# Patient Record
Sex: Male | Born: 1970 | Race: White | Hispanic: No | Marital: Single | Smoking: Current every day smoker
Health system: Southern US, Academic
[De-identification: ages and names within clinical notes are randomized; demographics above are authoritative.]

## PROBLEM LIST (undated history)

## (undated) ENCOUNTER — Encounter (HOSPITAL_COMMUNITY): Admission: RE | Payer: Self-pay | Source: Ambulatory Visit

## (undated) DIAGNOSIS — R519 Headache, unspecified: Secondary | ICD-10-CM

## (undated) DIAGNOSIS — Z9889 Other specified postprocedural states: Secondary | ICD-10-CM

## (undated) DIAGNOSIS — E119 Type 2 diabetes mellitus without complications: Secondary | ICD-10-CM

## (undated) DIAGNOSIS — K219 Gastro-esophageal reflux disease without esophagitis: Secondary | ICD-10-CM

## (undated) HISTORY — PX: HX GALL BLADDER SURGERY/CHOLE: SHX55

## (undated) HISTORY — DX: Other specified postprocedural states: Z98.890

## (undated) HISTORY — PX: HX TONSILLECTOMY: SHX27

## (undated) HISTORY — PX: SINUS SURGERY: SHX187

## (undated) HISTORY — PX: PENIS SURGERY: SHX741

## (undated) HISTORY — DX: Headache, unspecified: R51.9

## (undated) HISTORY — PX: LASIK: SHX215

## (undated) HISTORY — PX: HX VASECTOMY: SHX75

## (undated) HISTORY — PX: HX BACK SURGERY: SHX140

## (undated) HISTORY — PX: HX WISDOM TEETH EXTRACTION: SHX21

## (undated) HISTORY — PX: HX APPENDECTOMY: SHX54

## (undated) SURGERY — ENDOSCOPIC SINUS SURGERY WITH SEPTOPLASTY BILATERAL
Anesthesia: General | Site: Nose | Laterality: Bilateral

---
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
## 2019-02-05 ENCOUNTER — Encounter (INDEPENDENT_AMBULATORY_CARE_PROVIDER_SITE_OTHER): Payer: Self-pay | Admitting: Otolaryngology

## 2019-02-05 ENCOUNTER — Ambulatory Visit (INDEPENDENT_AMBULATORY_CARE_PROVIDER_SITE_OTHER): Payer: BC Managed Care – PPO | Admitting: Otolaryngology

## 2019-02-05 ENCOUNTER — Other Ambulatory Visit: Payer: Self-pay

## 2019-02-05 VITALS — BP 156/86 | HR 93 | Temp 97.8°F | Ht 71.0 in | Wt 234.1 lb

## 2019-02-05 DIAGNOSIS — T485X5A Adverse effect of other anti-common-cold drugs, initial encounter: Secondary | ICD-10-CM

## 2019-02-05 DIAGNOSIS — J329 Chronic sinusitis, unspecified: Secondary | ICD-10-CM

## 2019-02-05 DIAGNOSIS — J310 Chronic rhinitis: Secondary | ICD-10-CM

## 2019-02-05 DIAGNOSIS — J339 Nasal polyp, unspecified: Secondary | ICD-10-CM

## 2019-02-05 DIAGNOSIS — J342 Deviated nasal septum: Secondary | ICD-10-CM

## 2019-02-05 NOTE — Progress Notes (Signed)
[facility]  OTOLARYNGOLOGY DEPARTMENT    HISTORY AND PHYSICAL    PATIENT NAME:  James Frost  MRN:  [id number]  DOB:  1970/04/06  DATE OF SERVICE:  02/05/2019      Chief Complaint:    Chief Complaint   Patient presents with   . Sinus Problem   . Nasal Airway Problem       HPI:  James Frost is a 49 y.o. male presenting for evaluation of nasal obstruction and sinusitis.  He report complete obstruction on the left side of his nose in the past 2 months.  He reports recurrent sinusitis all the summer and he has had multiple courses of antibiotics including Z-Pak multiple times and Bactrim last time.  He had CT scan of his sinuses few days ago.  He is using generic Afrin every day which is helping the right side but not the left side.  No epistaxis.    SNOT-22 score: 32    Past Medical History:  Past Medical History:   Diagnosis Date   . Headache    . Hx of LASIK            Past Surgical History:  Past Surgical History:   Procedure Laterality Date   . HX APPENDECTOMY     . HX CHOLECYSTECTOMY     . HX TONSILLECTOMY     . HX VASECTOMY     . HX WISDOM TEETH EXTRACTION     . LASIK             Medications:  Current Outpatient Medications   Medication Sig   . omeprazole (PRILOSEC) 20 mg Oral Capsule, Delayed Release(E.C.)    . sildenafiL, pulm.hypertension, (REVATIO) 20 mg Tablet TAKE 1 5 PILLS BY MOUTH AS NEEDED ONE HOUR PRIOR TO INTERCOURSE ON AN EMPTY STOMACH (NO MORE THAN 5 IN 24 HOURS)   . zolpidem (AMBIEN) 5 mg Oral Tablet TAKE 1 TABLET BY MOUTH AT BEDTIME       Allergies:  No Known Allergies    Family History:  Family Medical History:     Problem Relation (Age of Onset)    Cancer Paternal Grandfather    Diabetes Mother, Father, Maternal Grandmother              Social History:  Social History     Tobacco Use   Smoking Status Current Every Day Smoker   . Packs/day: 1.00   . Years: 31.00   . Pack years: 31.00   . Types: Cigarettes   Smokeless Tobacco Current User   . Types: Chew   Tobacco  Comment    Trying to quit     Social History     Substance and Sexual Activity   Alcohol Use Yes   . Alcohol/week: 1.0 standard drinks   . Types: 1 Cans of beer per week         Review of Systems:  No fever chest pain or shortness of breath  All other systems were reviewed and found to be negative except the ones mentioned in the HPI    PHYSICAL EXAM    Vitals:    02/05/19 1432   BP: (!) 156/86   Pulse: 93   Temp: 36.6 C (97.8 F)   SpO2: 96%   Weight: 106 kg (234 lb 2.1 oz)   Height: 1.803 m (5\' 11" )   BMI: 32.72  General Appearance: Pleasant, cooperative, healthy, and in no acute distress.  Eyes: Conjunctivae/corneas clear, EOM's intact.  Head and Face: Normocephalic, atraumatic.  Face symmetric, no obvious lesions.   Pinnae: Normal shape and position.   External auditory canals:  Patent without inflammation.  Tympanic membranes:  Intact, translucent, midposition, middle ear aerated.  Nose:  External pyramid midline.  See scope note.  Oral Cavity/Oropharynx: No mucosal lesions, masses, or pharyngeal asymmetry.  Neck:  No palpable thyroid, salivary gland, or neck masses.  Heme/Lymph:  No cervical adenopathy.  Cardiovascular:  Good perfusion of upper extremities.  No cyanosis of the hands or fingers.  Lungs: No apparent stridorous breathing. No acute distress.  Skin: Skin warm and dry.  Neurologic: Cranial nerves:  grossly intact.  Psychiatric:  Alert and oriented x 3.      Procedure: rigid nasal endoscopy  Pre-operative Dx:  Nasal obstruction and chronic rhinosinusitis  Post-operative Dx: same    Description: bilateral side(s) of the nose anesthetized with Afrin/lidocaine.  A 30-degree rigid endoscope was passed into the nose with the following findings:  Septum:  Moderately deviated to the right side  Right  Inferior turbinate:  Normal  Middle turbinate:  Swollen mucosa  Polyps: 0  Edema: 1  Drainage: 1  Scarring: 0  Crusting: 0  LK score: 2  NPS: 0    Left:  Inferior turbinate:  Congested and  edematous  Middle turbinate:  Very edematous mucosa  Polyps: 1 (polyp in the middle meatus but I was not sure if a looked like papilloma versus polyp)  Edema: 2  Drainage: 1  Scarring: 0  Crusting: 0  LK score: 4  NPS: 1    Nasopharynx: no obstruction or mass lesions    The patient tolerated the procedure well.      Ignatz Deis A. Wilder Glade, MD  Associate Professor  Otolaryngology-Head and [place]              Review of images:  CT scan from [facility] performed few days ago was reviewed.  His complete opacification of all sinuses on the left side except frontal and sphenoids which are partially opacified.  However there is also some calcification of the middle turbinate and some calcifications in the area of the middle meatus and anterior ethmoid.  On the right side there is partial opacification of all sinuses as well.  CT LM score: 17      ASESSMENT AND PLAN:       1. Chronic rhinosinusitis    2. Nasal polyposis    3. Nasal septal deviation    4. Rhinitis medicamentosa      I cannot rule out a inverted papilloma on the left side    Plan:  FESS, septoplasty: Risks and benefits as well as alternatives discussed with the patient. All questions were answered. He agreed to proceed with surgery. Consent obtained. We will schedule in the near future.  Follow up on the day of surgery or earlier if needed.      Orders Placed This Encounter   . NASAL ENDOSCOPY DIAGNOSTIC-RIGID (AMB ONLY)   . COVID-19 SCREENING - PREOP         Amira Podolak A. Wilder Glade, MD  Associate Professor  Otolaryngology-Head and Neck Surgery  [facility]    CC:    PCP Carma Leaven, MD  [address]  [place] [address]   Referring Provider Self, Referral  No address on file

## 2019-02-05 NOTE — Procedures (Signed)
Procedure: rigid nasal endoscopy  Pre-operative Dx:  Nasal obstruction and chronic rhinosinusitis  Post-operative Dx: same    Description: bilateral side(s) of the nose anesthetized with Afrin/lidocaine.  A 30-degree rigid endoscope was passed into the nose with the following findings:  Septum:  Moderately deviated to the right side  Right  Inferior turbinate:  Normal  Middle turbinate:  Swollen mucosa  Polyps: 0  Edema: 1  Drainage: 1  Scarring: 0  Crusting: 0  LK score: 2  NPS: 0    Left:  Inferior turbinate:  Congested and edematous  Middle turbinate:  Very edematous mucosa  Polyps: 1 (polyp in the middle meatus but I was not sure if a looked like papilloma versus polyp)  Edema: 2  Drainage: 1  Scarring: 0  Crusting: 0  LK score: 4  NPS: 1    Nasopharynx: no obstruction or mass lesions    The patient tolerated the procedure well.      Cecile Guevara A. Candelaria Celeste, MD  Associate Professor  Otolaryngology-Head and Neck Surgery  [facility]

## 2019-02-07 ENCOUNTER — Ambulatory Visit
Admission: RE | Admit: 2019-02-07 | Discharge: 2019-02-07 | Disposition: A | Discharge status: Home / Self Care | Payer: BC Managed Care – PPO | Source: Ambulatory Visit | Attending: ORTHOPEDIC, SPORTS MEDICINE | Admitting: ORTHOPEDIC, SPORTS MEDICINE

## 2019-02-07 ENCOUNTER — Ambulatory Visit (HOSPITAL_BASED_OUTPATIENT_CLINIC_OR_DEPARTMENT_OTHER): Payer: BC Managed Care – PPO | Admitting: ORTHOPEDIC, SPORTS MEDICINE

## 2019-02-07 ENCOUNTER — Encounter (HOSPITAL_BASED_OUTPATIENT_CLINIC_OR_DEPARTMENT_OTHER): Payer: Self-pay | Admitting: ORTHOPEDIC, SPORTS MEDICINE

## 2019-02-07 ENCOUNTER — Other Ambulatory Visit: Payer: Self-pay

## 2019-02-07 VITALS — BP 141/93 | HR 88 | Temp 97.0°F | Ht 71.5 in | Wt 231.9 lb

## 2019-02-07 DIAGNOSIS — M25811 Other specified joint disorders, right shoulder: Secondary | ICD-10-CM | POA: Insufficient documentation

## 2019-02-07 DIAGNOSIS — S43431A Superior glenoid labrum lesion of right shoulder, initial encounter: Secondary | ICD-10-CM

## 2019-02-07 DIAGNOSIS — M25511 Pain in right shoulder: Secondary | ICD-10-CM

## 2019-02-07 DIAGNOSIS — S43431D Superior glenoid labrum lesion of right shoulder, subsequent encounter: Secondary | ICD-10-CM | POA: Insufficient documentation

## 2019-02-07 DIAGNOSIS — S43439A Superior glenoid labrum lesion of unspecified shoulder, initial encounter: Secondary | ICD-10-CM

## 2019-02-07 DIAGNOSIS — M7541 Impingement syndrome of right shoulder: Secondary | ICD-10-CM

## 2019-02-07 MED ORDER — LIDOCAINE (PF) 10 MG/ML (1 %) INJECTION SOLUTION
3.00 mL | Freq: Once | INTRAMUSCULAR | 0 refills | Status: AC
Start: 2019-02-07 — End: 2019-02-07

## 2019-02-07 MED ORDER — BUPIVACAINE (PF) 0.25 % (2.5 MG/ML) INJECTION SOLUTION
2.00 mL | Freq: Once | INTRAMUSCULAR | 0 refills | Status: AC
Start: 2019-02-07 — End: 2019-02-07

## 2019-02-07 MED ORDER — TRIAMCINOLONE ACETONIDE 40 MG/ML SUSPENSION FOR INJECTION
40.00 mg | Freq: Once | INTRAMUSCULAR | 0 refills | Status: AC
Start: 2019-02-07 — End: 2019-02-07

## 2019-02-07 NOTE — Procedures (Signed)
Right shoulder intraarticular steroid injection: Time out performed after risks and benefits outlined to the patient and wishing to proceed.   The Right shoulder identified for approach though Nevaiser's portal.  Skin cleaned with alcohol swabs x3 and iodine x3.  Ethyl chloride to numb the skin followed by alcohol to sterilize site.  A 22 G needle used to inject 40 mg of Kenalog, 3 mL of 1% lidocaine, and 2 mL of 0.5% bupivicaine into the joint with a small amount evacuated in the subacromial region.  Hemostasis achieved and sterile bandage placed.  No complications apparent.  Diagnostically 80-90% response with O'Brien's showing full strength and no pain, and negative Neer and Hawkins sign.

## 2019-02-07 NOTE — H&P (Signed)
PATIENT NAME: James Frost NUMBER:  [id number]  DATE OF SERVICE: 02/07/2019  DATE OF BIRTH:  1970-01-16    HISTORY AND PHYSICAL    CHIEF COMPLAINT:  Right shoulder pain.    HISTORY OF PRESENT ILLNESS:  This is a 49 year old gentleman who back in August was helping paint a house when he was pulling down to the lift the ladder up and felt pain in his right shoulder. It is a burning pain.  The pain has not gotten any better.  He has not done any therapy or injection.  He has only taken ibuprofen. Sleeping bothers him. Keeping his arm above his head bothers him.  The pain is on the anterior aspect of his shoulder. It is aching. It is sharp at 7 out of 10 on a scale.    PAST MEDICAL HISTORY:  Acid reflux and gout.    PAST SURGICAL HISTORY:  He has had a cholecystectomy, appendectomy, vasectomy, and his wisdom teeth removed.    CURRENT MEDICATION:  Omeprazole.    ALLERGIES:  The patient denies.    SOCIAL HISTORY:  He works as a Agricultural consultant.  Smokes a pack a day and chews tobacco.  He drinks alcohol seldomly.  He likes hunting and fishing.  He has been continuing to work.    FAMILY HISTORY:  All areas were reviewed and were negative except for diabetes.    REVIEW OF SYSTEMS:  All areas were reviewed and were negative.    PHYSICAL EXAMINATION:  Blood pressure 141/93, pulse 88, temperature 36.1degrees Celsius, weight 100.5 kg, height 181.6 cm.  In general, well-appearing male in no acute distress. HEENT is grossly normal.  Breathing nonlabored. Abdomen nondistended. Pulses regular. In regard to his right upper extremity, he has good range of motion of the shoulder.  He can get his arm up to 160 degrees, abduction 90 degrees, external rotation to 60 degrees, and internally rotate to the lumbar spine.  He does have positive Yergason's, positive Speed's, positive O'Brien.  He has positive Neer, positive Hawkins.  His rotator cuff has 5 out of 5 strength throughout.  No pain with provocative maneuvers of his  cuff.    IMAGING:  MRI and x-rays were reviewed in PACS with Dr. Augustin Coupe and do show a little bit of impingement of his rotator cuff.  His rotator cuff looks intact and does appear to have fluid around his biceps tendon and SLAP tear.    PROCEDURE:  Right shoulder glenohumeral injection and subacromial injection were performed today under the supervision of Dr. Augustin Coupe using Neviaser portal.  The skin was cleansed with alcohol and iodine.  The skin was anesthetized with ethyl chloride.  Using a 22-gauge needle, we were able to inject into the glenohumeral joint 40 of Kenalog, 3 mL of 1% lidocaine without epinephrine, and 2 mL of 0.25% bupivacaine without epinephrine.  Contents were evacuated freely while we injected into the subacromial space.  Hemostasis was obtained with a Band-Aid.  The patient had 90-100% relief of pain in his shoulder with Neer's and Hawkins and with O'Brien's testing.    ASSESSMENT:  A 49 year old gentleman with right shoulder impingement and superior labrum anterior and posterior (SLAP) tear.    PLAN:  We injected his shoulder today.  We also gave him a script for physical therapy to do this for 6-8 weeks.  We will make an appointment for him to follow up in 2 months, although if he is doing well at that time he can  cancel the appointment.  The patient is agreeable with the plan. All questions were answered.        Gray Bernhardt, MD    ____________________________________________________________________________________________  I have seen and examined the patient with the resident, reviewed and agree with Dr. Coral Else note with the following additions/addendums as written.  I personally went over the plan and findings with the patient.  I personally placed the procedure note that was present in the clinic room for the entirety of the procedure.  Patient had an excellent diagnostic response to the injection.  Will continue to follow for durability.  He will get into a physical therapy program and  will see him back in 8 weeks, though if back to his prior asymptomatic baseline, he can contact us to let us know that he is okay.  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      Felipe Cabell Marin Comment, MD  Assistant Professor  [place] Department of Orthopaedics              DD:  02/07/2019 11:23:48  DT:  02/07/2019 11:54:42 BEC  D#:  [id number]

## 2019-02-13 ENCOUNTER — Other Ambulatory Visit: Payer: Self-pay

## 2019-02-13 ENCOUNTER — Inpatient Hospital Stay (HOSPITAL_COMMUNITY)
Admission: RE | Admit: 2019-02-13 | Discharge: 2019-02-13 | Disposition: A | Discharge status: Home / Self Care | Payer: BC Managed Care – PPO | Source: Ambulatory Visit

## 2019-02-13 ENCOUNTER — Encounter (HOSPITAL_COMMUNITY): Payer: Self-pay

## 2019-02-13 HISTORY — DX: Gastro-esophageal reflux disease without esophagitis: K21.9

## 2019-02-17 ENCOUNTER — Ambulatory Visit: Payer: BC Managed Care – PPO | Attending: Otolaryngology

## 2019-02-17 DIAGNOSIS — J310 Chronic rhinitis: Secondary | ICD-10-CM | POA: Insufficient documentation

## 2019-02-17 DIAGNOSIS — J342 Deviated nasal septum: Secondary | ICD-10-CM | POA: Insufficient documentation

## 2019-02-17 DIAGNOSIS — Z01812 Encounter for preprocedural laboratory examination: Secondary | ICD-10-CM | POA: Insufficient documentation

## 2019-02-17 DIAGNOSIS — J329 Chronic sinusitis, unspecified: Secondary | ICD-10-CM

## 2019-02-17 DIAGNOSIS — T485X5A Adverse effect of other anti-common-cold drugs, initial encounter: Secondary | ICD-10-CM | POA: Insufficient documentation

## 2019-02-17 DIAGNOSIS — Z20822 Contact with and (suspected) exposure to covid-19: Secondary | ICD-10-CM | POA: Insufficient documentation

## 2019-02-17 DIAGNOSIS — J339 Nasal polyp, unspecified: Secondary | ICD-10-CM | POA: Insufficient documentation

## 2019-02-17 LAB — COVID-19 ~~LOC~~ MOLECULAR LAB TESTING: 2019-nCoV/SARS-CoV-2: NOT DETECTED

## 2019-02-19 ENCOUNTER — Ambulatory Visit (HOSPITAL_COMMUNITY): Payer: BC Managed Care – PPO | Admitting: ANESTHESIOLOGY

## 2019-02-19 ENCOUNTER — Encounter (HOSPITAL_COMMUNITY)
Admission: RE | Disposition: A | Discharge status: Home / Self Care | Payer: Self-pay | Source: Ambulatory Visit | Attending: Otolaryngology

## 2019-02-19 ENCOUNTER — Other Ambulatory Visit: Payer: Self-pay

## 2019-02-19 ENCOUNTER — Ambulatory Visit (HOSPITAL_BASED_OUTPATIENT_CLINIC_OR_DEPARTMENT_OTHER): Payer: BC Managed Care – PPO | Admitting: ANESTHESIOLOGY

## 2019-02-19 ENCOUNTER — Ambulatory Visit
Admission: RE | Admit: 2019-02-19 | Discharge: 2019-02-19 | Disposition: A | Discharge status: Home / Self Care | Payer: BC Managed Care – PPO | Source: Ambulatory Visit | Attending: Otolaryngology | Admitting: Otolaryngology

## 2019-02-19 ENCOUNTER — Encounter (HOSPITAL_COMMUNITY): Payer: Self-pay | Admitting: Otolaryngology

## 2019-02-19 DIAGNOSIS — J343 Hypertrophy of nasal turbinates: Secondary | ICD-10-CM

## 2019-02-19 DIAGNOSIS — J339 Nasal polyp, unspecified: Secondary | ICD-10-CM

## 2019-02-19 DIAGNOSIS — B449 Aspergillosis, unspecified: Secondary | ICD-10-CM | POA: Insufficient documentation

## 2019-02-19 DIAGNOSIS — J329 Chronic sinusitis, unspecified: Secondary | ICD-10-CM

## 2019-02-19 DIAGNOSIS — J321 Chronic frontal sinusitis: Secondary | ICD-10-CM | POA: Insufficient documentation

## 2019-02-19 DIAGNOSIS — B957 Other staphylococcus as the cause of diseases classified elsewhere: Secondary | ICD-10-CM | POA: Insufficient documentation

## 2019-02-19 DIAGNOSIS — J309 Allergic rhinitis, unspecified: Secondary | ICD-10-CM

## 2019-02-19 DIAGNOSIS — J342 Deviated nasal septum: Secondary | ICD-10-CM

## 2019-02-19 DIAGNOSIS — J3489 Other specified disorders of nose and nasal sinuses: Secondary | ICD-10-CM

## 2019-02-19 DIAGNOSIS — J322 Chronic ethmoidal sinusitis: Secondary | ICD-10-CM | POA: Insufficient documentation

## 2019-02-19 DIAGNOSIS — J324 Chronic pansinusitis: Secondary | ICD-10-CM

## 2019-02-19 DIAGNOSIS — D7218 Eosinophilia in diseases classified elsewhere: Secondary | ICD-10-CM | POA: Insufficient documentation

## 2019-02-19 DIAGNOSIS — J323 Chronic sphenoidal sinusitis: Secondary | ICD-10-CM | POA: Insufficient documentation

## 2019-02-19 DIAGNOSIS — J320 Chronic maxillary sinusitis: Secondary | ICD-10-CM | POA: Insufficient documentation

## 2019-02-19 DIAGNOSIS — B9689 Other specified bacterial agents as the cause of diseases classified elsewhere: Secondary | ICD-10-CM | POA: Insufficient documentation

## 2019-02-19 SURGERY — ENDOSCOPIC SINUS SURGERY WITH NAVIGATION
Anesthesia: General | Site: Nose | Wound class: Clean Contaminated Wounds-The respiratory, GI, Genital, or urinary

## 2019-02-19 MED ORDER — AMOXICILLIN 875 MG-POTASSIUM CLAVULANATE 125 MG TABLET
1.00 | ORAL_TABLET | Freq: Two times a day (BID) | ORAL | 0 refills | Status: AC
Start: 2019-02-19 — End: 2019-03-01

## 2019-02-19 MED ORDER — PREDNISONE 20 MG TABLET
ORAL_TABLET | ORAL | 0 refills | Status: DC
Start: 2019-02-19 — End: 2021-04-28

## 2019-02-19 MED ORDER — OXYMETAZOLINE 0.05 % NASAL SPRAY
30.0000 mL | Freq: Once | NASAL | Status: DC | PRN
Start: 2019-02-19 — End: 2019-02-19
  Administered 2019-02-19: 10:00:00 30 mL via TOPICAL

## 2019-02-19 MED ORDER — CIPROFLOXACIN 0.3 %-DEXAMETHASONE 0.1 % EAR DROPS,SUSPENSION
5.0000 mL | Freq: Once | OTIC | Status: DC | PRN
Start: 2019-02-19 — End: 2019-02-19
  Administered 2019-02-19: 3 mL via TOPICAL

## 2019-02-19 MED ORDER — LIDOCAINE 1 %-EPINEPHRINE 1:100,000 INJECTION SOLUTION
INTRAMUSCULAR | Status: AC
Start: 2019-02-19 — End: 2019-02-19
  Filled 2019-02-19: qty 40

## 2019-02-19 MED ORDER — REMIFENTANIL 50 MCG/ML INFUSION - FOR ANES
INTRAVENOUS | Status: DC | PRN
Start: 2019-02-19 — End: 2019-02-19
  Administered 2019-02-19: .025 ug/kg/min via INTRAVENOUS
  Administered 2019-02-19: .1 ug/kg/min via INTRAVENOUS
  Administered 2019-02-19: 0 ug/kg/min via INTRAVENOUS
  Administered 2019-02-19: .15 ug/kg/min via INTRAVENOUS
  Administered 2019-02-19: .1 ug/kg/min via INTRAVENOUS
  Administered 2019-02-19: .03 ug/kg/min via INTRAVENOUS
  Administered 2019-02-19: .05 ug/kg/min via INTRAVENOUS

## 2019-02-19 MED ORDER — PROPOFOL 10 MG/ML INTRAVENOUS EMULSION
INTRAVENOUS | Status: DC | PRN
Start: 2019-02-19 — End: 2019-02-19
  Administered 2019-02-19: 0 ug/kg/min via INTRAVENOUS
  Administered 2019-02-19: 85 ug/kg/min via INTRAVENOUS
  Administered 2019-02-19 (×2): 100 ug/kg/min via INTRAVENOUS
  Administered 2019-02-19: 11:00:00 90 ug/kg/min via INTRAVENOUS
  Administered 2019-02-19: 12:00:00 50 ug/kg/min via INTRAVENOUS
  Administered 2019-02-19: 200 ug/kg/min via INTRAVENOUS
  Administered 2019-02-19: 100 ug/kg/min via INTRAVENOUS
  Administered 2019-02-19: 150 ug/kg/min via INTRAVENOUS

## 2019-02-19 MED ORDER — FENTANYL (PF) 50 MCG/ML INJECTION SOLUTION
12.5000 ug | INTRAMUSCULAR | Status: DC | PRN
Start: 2019-02-19 — End: 2019-02-19

## 2019-02-19 MED ORDER — HYDROMORPHONE 1 MG/ML INJECTION WRAPPER
INJECTION | Freq: Once | INTRAMUSCULAR | Status: DC | PRN
Start: 2019-02-19 — End: 2019-02-19
  Administered 2019-02-19: .5 mg via INTRAVENOUS

## 2019-02-19 MED ORDER — HYDROCODONE 5 MG-ACETAMINOPHEN 325 MG TABLET
1.00 | ORAL_TABLET | ORAL | 0 refills | Status: DC | PRN
Start: 2019-02-19 — End: 2021-04-28

## 2019-02-19 MED ORDER — SODIUM CHLORIDE 0.9 % (FLUSH) INJECTION SYRINGE
2.0000 mL | INJECTION | Freq: Three times a day (TID) | INTRAMUSCULAR | Status: DC
Start: 2019-02-19 — End: 2019-02-19

## 2019-02-19 MED ORDER — ROCURONIUM 10 MG/ML INTRAVENOUS SOLUTION
Freq: Once | INTRAVENOUS | Status: DC | PRN
Start: 2019-02-19 — End: 2019-02-19
  Administered 2019-02-19 (×2): 50 mg via INTRAVENOUS
  Administered 2019-02-19: 20 mg via INTRAVENOUS

## 2019-02-19 MED ORDER — OXYMETAZOLINE 0.05 % NASAL SPRAY
1.0000 | Freq: Once | NASAL | Status: DC | PRN
Start: 2019-02-19 — End: 2019-02-19

## 2019-02-19 MED ORDER — LACTATED RINGERS INTRAVENOUS SOLUTION
INTRAVENOUS | Status: DC
Start: 2019-02-19 — End: 2019-02-19

## 2019-02-19 MED ORDER — LIDOCAINE 1 %-EPINEPHRINE 1:100,000 INJECTION SOLUTION
5.0000 mL | Freq: Once | INTRAMUSCULAR | Status: DC | PRN
Start: 2019-02-19 — End: 2019-02-19
  Administered 2019-02-19: 10:00:00 15 mL via INTRAMUSCULAR

## 2019-02-19 MED ORDER — DEXAMETHASONE SODIUM PHOSPHATE 4 MG/ML INJECTION SOLUTION
Freq: Once | INTRAMUSCULAR | Status: DC | PRN
Start: 2019-02-19 — End: 2019-02-19
  Administered 2019-02-19: 4 mg via INTRAVENOUS

## 2019-02-19 MED ORDER — ACETAMINOPHEN 1,000 MG/100 ML (10 MG/ML) INTRAVENOUS SOLUTION
Freq: Once | INTRAVENOUS | Status: DC | PRN
Start: 2019-02-19 — End: 2019-02-19
  Administered 2019-02-19: 1000 mg via INTRAVENOUS

## 2019-02-19 MED ORDER — LIDOCAINE 1 %-EPINEPHRINE 1:100,000 INJECTION SOLUTION
5.0000 mL | Freq: Once | INTRAMUSCULAR | Status: DC | PRN
Start: 2019-02-19 — End: 2019-02-19

## 2019-02-19 MED ORDER — EPINEPHRINE 0.1 MG/ML INJECTION SYRINGE
10.0000 mL | INJECTION | Freq: Once | INTRAMUSCULAR | Status: DC | PRN
Start: 2019-02-19 — End: 2019-02-19

## 2019-02-19 MED ORDER — SUGAMMADEX 100 MG/ML INTRAVENOUS SOLUTION
Freq: Once | INTRAVENOUS | Status: DC | PRN
Start: 2019-02-19 — End: 2019-02-19
  Administered 2019-02-19: 300 mg via INTRAVENOUS

## 2019-02-19 MED ORDER — DEXMEDETOMIDINE 4 MCG/ML IV DILUTION
Freq: Once | INTRAMUSCULAR | Status: DC | PRN
Start: 2019-02-19 — End: 2019-02-19
  Administered 2019-02-19: 11:00:00 12 ug via INTRAVENOUS

## 2019-02-19 MED ORDER — SODIUM CHLORIDE 0.9 % (FLUSH) INJECTION SYRINGE
2.0000 mL | INJECTION | INTRAMUSCULAR | Status: DC | PRN
Start: 2019-02-19 — End: 2019-02-19

## 2019-02-19 MED ORDER — MIDAZOLAM 1 MG/ML INJECTION SOLUTION
Freq: Once | INTRAMUSCULAR | Status: DC | PRN
Start: 2019-02-19 — End: 2019-02-19
  Administered 2019-02-19: 2 mg via INTRAVENOUS

## 2019-02-19 MED ORDER — OXYMETAZOLINE 0.05 % NASAL SPRAY
NASAL | Status: AC
Start: 2019-02-19 — End: 2019-02-19
  Filled 2019-02-19: qty 60

## 2019-02-19 MED ORDER — FENTANYL (PF) 50 MCG/ML INJECTION SOLUTION
25.0000 ug | INTRAMUSCULAR | Status: DC | PRN
Start: 2019-02-19 — End: 2019-02-19
  Administered 2019-02-19 (×2): 25 ug via INTRAVENOUS
  Filled 2019-02-19: qty 2

## 2019-02-19 MED ORDER — PROPOFOL 10 MG/ML IV BOLUS
INJECTION | Freq: Once | INTRAVENOUS | Status: DC | PRN
Start: 2019-02-19 — End: 2019-02-19
  Administered 2019-02-19: 100 mg via INTRAVENOUS
  Administered 2019-02-19: 200 mg via INTRAVENOUS

## 2019-02-19 MED ORDER — EPINEPHRINE 1 MG/10 ML (100 MCG/ML) IN SODIUM CHLOR,ISO-OSM IV SYRINGE
INJECTION | INTRAVENOUS | Status: AC
Start: 2019-02-19 — End: 2019-02-19
  Filled 2019-02-19: qty 10

## 2019-02-19 MED ORDER — LIDOCAINE (PF) 100 MG/5 ML (2 %) INTRAVENOUS SYRINGE
INJECTION | Freq: Once | INTRAVENOUS | Status: DC | PRN
Start: 2019-02-19 — End: 2019-02-19
  Administered 2019-02-19: 100 mg via INTRAVENOUS

## 2019-02-19 MED ORDER — NEOMYCIN-POLYMYXIN-PRAMOXINE 3.5 MG-10,000 UNIT-10 MG/GRAM TOP CREAM
TOPICAL_CREAM | Freq: Once | CUTANEOUS | Status: DC | PRN
Start: 2019-02-19 — End: 2019-02-19

## 2019-02-19 MED ORDER — FENTANYL (PF) 50 MCG/ML INJECTION SOLUTION
Freq: Once | INTRAMUSCULAR | Status: DC | PRN
Start: 2019-02-19 — End: 2019-02-19
  Administered 2019-02-19 (×2): 50 ug via INTRAVENOUS

## 2019-02-19 MED ORDER — ONDANSETRON HCL (PF) 4 MG/2 ML INJECTION SOLUTION
Freq: Once | INTRAMUSCULAR | Status: DC | PRN
Start: 2019-02-19 — End: 2019-02-19
  Administered 2019-02-19: 4 mg via INTRAVENOUS

## 2019-02-19 MED ORDER — HYDROCODONE 5 MG-ACETAMINOPHEN 325 MG TABLET
1.0000 | ORAL_TABLET | ORAL | Status: DC | PRN
Start: 2019-02-19 — End: 2019-02-19

## 2019-02-19 MED ORDER — LABETALOL 20 MG/4 ML (5 MG/ML) INTRAVENOUS SYRINGE
5.0000 mg | INJECTION | Freq: Once | INTRAVENOUS | Status: AC
Start: 2019-02-19 — End: 2019-02-19
  Administered 2019-02-19: 5 mg via INTRAVENOUS
  Filled 2019-02-19: qty 4

## 2019-02-19 SURGICAL SUPPLY — 64 items
BLADE 15 BD CNV SS SURG TISS STRL LF  DISP (SURGICAL CUTTING SUPPLIES) ×4 IMPLANT
BLADE 15 BD KNF SS SURG TISS S_TRL LF (CUTTING ELEMENTS) ×2
BLADE SHAVER 3.5MM RAD 90 SPINE M4 ROT (SURGICAL INSTRUMENTS) ×2 IMPLANT
BLANKET MISTRAL-AIR ADULT LWR BODY 55.9X40.2IN FRC AIR HI VOL BLWR INTUITIVE CONTROL PNL LRG LED (MISCELLANEOUS PT CARE ITEMS) ×2 IMPLANT
BLANKET WARMER 55.9INW X 40.2I_LOWER BODY MISTRAL-AIR (MISCELLANEOUS PT CARE ITEMS) ×2
BURR SURG 13CM 4MM 70D RVRS TA PER 30K STRL LF DISP (BURS)
BURR SURG 13CM 4MM 70D RVRS TAPER 30K STRL LF  DISP (BURS) IMPLANT
COAG SUCT 6IN 10FR LECTROVAC V_LAB CORD HAND FOOTSWITCH ACT (CAUTERY SUPPLIES) ×1
COAG SUCT 6IN 10FR VLAB FOOTSWITCH CORD THERM REDUCT STRL LF  DISP (CAUTERY SUPPLIES) ×2 IMPLANT
COAG SUCT 7.5IN 8FR GLD BIPOLAR INSL NSTK DISP (CAUTERY SUPPLIES) IMPLANT
COAG SUCT 7.5IN 8FR GLD INSL BIPOLAR NSTK DISP (CAUTERY SUPPLIES)
CONV USE 320027 - CHILDRENS USE 320025 - KIT RM TURNOVER CSTM NONST LF (KITS & TRAYS (DISPOSABLE))
CONV USE 320027 - CHILDRENS USE 320025 - KIT RM TURNOVER CUSTOM NONST LF (KITS & TRAYS (DISPOSABLE)) IMPLANT
CONV USE ITEM 337890 - PACK SURG BSIN 2 STRL LF  DISP (CUSTOM TRAYS & PACK) ×2 IMPLANT
CONV USE ITEM 338652 - PACK SURG SIN NONST DISP LF (CUSTOM TRAYS & PACK) ×2 IMPLANT
CORD BIPOLAR 12IN SILVERGLIDE STR CABLE STRL LF  DISP (CAUTERY SUPPLIES) ×2 IMPLANT
CORD BIPOLAR 12IN SILVERGLIDE_CABLE STRL LF DISP (CAUTERY SUPPLIES) ×1
COUNTER 40 CNT MAG DVN BOXLOCKS STRL LF  BLK SHARP 1840 PLASTIC FOAM XL DISP (NEEDLES & SYRINGE SUPPLIES) IMPLANT
COVER WAND RFD STRL 50EA/CS_01-0020 (EQUIPMENT MINOR) ×1
COVER WND RF DETECT STRL CLR EQP (EQUIPMENT MINOR) ×2 IMPLANT
DRAPE POUCH INSTRU 2 POCKET_1018 10EA/BX (EQUIPMENT MINOR) ×1
DUPE USE ITEM 319382 - PATTIE SURG 1/2IN X 3IN_CS/20 801407 (WOUND CARE SUPPLY) ×4 IMPLANT
DUPE USE ITEM 319445 - SUTURE CHR GUT 3-0 KS 27IN MON_OF ABS (SUTURE/WOUND CLOSURE) IMPLANT
DUPE USE ITEM 319469 - SUTURE PLN GUT 4-0 PS-4 MTPS 1_8IN TAN MONOF ABS (SUTURE/WOUND CLOSURE) IMPLANT
ELECTRODE ESURG BLADE 2.75IN 3/32IN EDGE STRL .2IN DISP INSL STD SHAFT HEX LOCK LF (CAUTERY SUPPLIES) IMPLANT
ELECTRODE ESURG BLADE PNCL 10FT VLAB STRL SS DISP BUTTON SWH HEX LOCK CORD HLSTR LF  ACPT 3/32IN STD (CAUTERY SUPPLIES) IMPLANT
ELECTRODE ESURG BLADE STD 2.75 IN 3/32IN STRL EDGE .2IN DISP (CAUTERY SUPPLIES)
ELECTRODE ESURG BLADE STD 2.75_IN 3/32IN STRL EDGE .2IN DISP (CAUTERY SUPPLIES)
ELECTRODE ESURG NEEDLE 4IN 3/32IN EDGE STRL .2IN DISP INSL STD SHAFT XTD LF (CAUTERY SUPPLIES) ×2 IMPLANT
ELECTRODE ESURG NEEDLE 4IN 3/3_2IN EDGE STRL DISP INSL XTD (CAUTERY SUPPLIES) ×1
ELECTRODE PATIENT RTN 9FT VLAB C30- LB RM PHSV ACRL FOAM CORD NONIRRITATE NONSENSITIZE ADH STRP (CAUTERY SUPPLIES) IMPLANT
ELECTRODE PATIENT RTN 9FT VLAB_REM C30- LB PLHSV ACRL FOAM (CAUTERY SUPPLIES)
GARMENT COMPRESS MED CALF CENTAURA NYL VASOGRAD LTWT BRTHBL SEQ FIL BLU 18- IN (ORTHOPEDICS (NOT IMPLANTS)) ×2 IMPLANT
GOWN SURG XL AAMI L3 NONREINFO RCE HKLP CLSR STRL LTX PNK SMS (DGOW)
GOWN SURG XL L3 NONREINFORCE HKLP CLSR STRL LTX PNK SMS 47IN (DGOW) IMPLANT
IMPLANT NASAL PROPEL MOMETASONE FUROATE CONTOUR MOMETASONE FUROATE STRL ×4 IMPLANT
KIT ROOM TURNOVER ~~LOC~~ CUSTOM (KITS & TRAYS (DISPOSABLE))
LABEL E-Z STICK STLEZP1 100EA/CS (LABELS/CHART SUPPLIES) ×1
LABEL MED EZ PEEL MRKR LF (LABELS/CHART SUPPLIES) ×2 IMPLANT
PACK SURG SIN NONST DISP LF (CUSTOM TRAYS & PACK) ×2
PAD ARMBOARD FOAM BLU_FP-ECARM (POSITIONING PRODUCTS)
PAD ARMBRD BLU (POSITIONING PRODUCTS) IMPLANT
POSITION POSITION 9IN DONUT HEAD FOAM (SUPP) IMPLANT
POUCH 11X7IN 2 ADH STRP 2 CMPRT STRDRP INSTR PLASTIC STRL (EQUIPMENT MINOR) ×2 IMPLANT
SOCK CAN MEDIVAC ASCP SPECI CNVRT NONST LF (TEST) ×2 IMPLANT
SPLINT NASAL 2X.6IN PLMR POSISEP X BCMPT STRL DISP (WOUND CARE SUPPLY) ×4 IMPLANT
SPLINT NASAL 2X.6IN PLMR POSIS_EP X BCMPT STRL DISP (WOUND CARE/ENTEROSTOMAL SUPPLY) ×2
SPLINT NASAL DOYLE WHT (ENT SUPPLIES) IMPLANT
STRAP POSITION KNEE FOAM SFT ADJ CNTCT CLSR LF (POSITIONING PRODUCTS) ×2 IMPLANT
SUTURE 2-0 KS PROLENE 30IN BLU MONOF NONAB (SUTURE/WOUND CLOSURE) IMPLANT
SUTURE 5-0 P-3 PROLENE 18IN UNDYED MONOF NONAB (SUTURE/WOUND CLOSURE) IMPLANT
SUTURE 5-0 P-3 PROLENE MTPS 18 IN UNDYED MONOF NONAB (SUTURE/WOUND CLOSURE)
SUTURE CHR GUT 3-0 KS 27IN MON OF ABS (SUTURE/WOUND CLOSURE)
SUTURE PLAIN GUT 4-0 SC-1 18IN TAN MONOF ABS (SUTURE/WOUND CLOSURE) IMPLANT
SUTURE PLN GUT 4-0 PS-4 MTPS 1 8IN TAN MONOF ABS (SUTURE/WOUND CLOSURE)
SUTURE PLN GUT 4-0 SC-1 18IN T_AN MONOF ABS (SUTURE/WOUND CLOSURE)
TRACKER NAVIGATE FUS  AXIEM ENT INSTR STRL LF  DISP (SURGICAL CUTTING SUPPLIES) ×2 IMPLANT
TRACKER NAVIGATE STRL LF  DISP (NEUR) ×2 IMPLANT
TUBING IRRG SS XPS (IRR) IMPLANT
TUBING IRRIG STRAIGHT SHOT_1895522 5EA/PK (IRR)
TUBING SUCT CLR 20FT 9/32IN MEDIVAC NCDTV M/M CONN STRL LF (Suction) ×2 IMPLANT
TUBING SUCT CONN 20FT LONG_STRL N720A (Suction) ×1
WIPE 3X3IN LF  ULTRACELL SURG INSTR SOLAN THK2MM (CLEN) ×2 IMPLANT
WIPE 3X3IN LF ULTRACELL SURG_INSTR SOLAN THK2MM (CLEN) ×1

## 2019-02-19 NOTE — Nurses Notes (Signed)
Patient discharged home with family.  AVS reviewed with patient/care giver.  A written copy of the AVS and discharge instructions was given to the patient/care giver.  Questions sufficiently answered as needed.  Patient/care giver encouraged to follow up with PCP as indicated.  In the event of an emergency, patient/care giver instructed to call 911 or go to the nearest emergency room.

## 2019-02-19 NOTE — Nurses Notes (Signed)
Dr. Marissa Nestle paged abut BP 160s/100s. Orders to give 5mg  labetalol in PACU.  , RN  02/19/2019, 13:10

## 2019-02-19 NOTE — Anesthesia Transfer of Care (Signed)
ANESTHESIA TRANSFER OF CARE   James Frost is a 49 y.o. ,male, Weight: 103 kg (227 lb 8.2 oz)   had Procedure(s) with comments:  ENDOSCOPIC SINUS SURGERY WITH NAVIGATION - covid 2/8  SEPTOPLASTY  performed  02/19/19   Primary Service: Arnold Long, MD    Past Medical History:   Diagnosis Date   . Esophageal reflux    . Headache    . Hx of LASIK       Allergy History as of 02/19/19      No Known Allergies              I completed my transfer of care / handoff to the receiving personnel during which we discussed:  Access, Airway, All key/critical aspects of case discussed, Expectation of post procedure, Fluids/Product, Gave opportunity for questions and acknowledgement of understanding, Antibiotics, Labs, Analgesia and PMHx    Post Location: PACU                                          Additional Info:VSS. Taken to PACU.  Report given to PACU RN.  Patient stable.                      Last OR Temp: Temperature: 36.7 C (98.1 F)  ABG:   Airway:* No LDAs found *  Blood pressure (!) 156/107, pulse 89, temperature 36.7 C (98.1 F), resp. rate (!) 23, height 1.803 m (5\' 11" ), weight 103 kg (227 lb 8.2 oz), SpO2 94 %.

## 2019-02-19 NOTE — Brief Op Note (Signed)
[facility]                                                     BRIEF OPERATIVE NOTE    Patient Name: James Frost, James Frost Number: [id number]  Date of Service: 02/19/2019   Date of Birth: [birth year]    All elements must be documented.    Pre-Operative Diagnosis: chronic rhinosinusitis, nasal septal deviation, nasal obstruction   Post-Operative Diagnosis: Same  Procedure(s)/Description:  Full functional endoscopic sinus surgery (bilateral maxillary, total ethmoid, sphenoid, and frontal sinuses), septoplasty   Findings/Complexity (inherent to the procedure performed): Mucosal inflammation and polypoid tissue throughout paranasal sinuses, worse on left. Likely allergic fungal sinusitis within left maxillary sinus. Septal deviation to the right with inferior left septal spur, significantly improved post-operatively      Attending Surgeon: Wilder Glade   Assistant(s): Mancel Bale    Anesthesia Type: General  Estimated Blood Loss: 50 ml  Blood Given: none  Fluids Given: 2 L crystalloid per anesthesia   Complications (not routinely expected or not inherent to difficulty/nature of procedure):none  Characteristic Event (routinely expected or inherent to the difficulty/nature of the procedure): none  Did the use of current and/or prior Anticoagulants impact the outcome of the case? N\A  Wound Class: Clean Contaminated Wounds -Respiratory, GI, Genital, or Urinary    Tubes: None  Drains: None  Specimens/ Cultures: Contour frontal sinus stents   Implants: Contour frontal sinus stent, Doyle splints, and Posi-sep packing bilaterally            Disposition: PACU - hemodynamically stable.  Condition: stable    Fernande Boyden, MD    Please discharge patient home from PACU once discharge criteria are met     I agree with the description of this procedure.  I was present for the entire procedure.    Kavaughn Faucett A. Wilder Glade, MD  Associate professor  Otolaryngology-Head and Neck Surgery  [facility]

## 2019-02-19 NOTE — Anesthesia Preprocedure Evaluation (Signed)
ANESTHESIA PRE-OP EVALUATION  Planned Procedure: ENDOSCOPIC SINUS SURGERY WITH NAVIGATION (Bilateral Nose)  SEPTOPLASTY (N/A Nose)  Review of Systems     anesthesia history negative     patient summary reviewed  nursing notes reviewed        Pulmonary  negative pulmonary ROS,    Cardiovascular    No peripheral edema,        GI/Hepatic/Renal    GERD     Endo/Other   neg endo/other ROS,        Neuro/Psych/MS    headaches     Cancer  negative hematology/oncology ROS,                    Physical Assessment      Patient summary reviewed and Nursing notes reviewed   Airway       Mallampati: II      Neck ROM: full              Dental                    Pulmonary    Breath sounds clear to auscultation  (-) no rhonchi, no decreased breath sounds, no wheezes, no rales and no stridor     Cardiovascular    Rhythm: regular  Rate: Normal  (-) no friction rub, carotid bruit is not present, no peripheral edema and no murmur     Other findings            Plan  ASA 1     Planned anesthesia type: general endotracheal              Intravenous induction     Anesthesia issues/risks discussed are: Sore Throat, Aspiration, Blood Loss, PONV, Cardiac Events/MI and Stroke.  Anesthetic plan and risks discussed with patient.          Patient's NPO status is appropriate for Anesthesia.

## 2019-02-19 NOTE — OR Surgeon (Signed)
[facility]                                                                                                Department of otolaryngology  OPERATIVE NOTE    PATIENT NAME:  James Frost  MRN:  [id number]  DOB:  1970-12-29  DATE OF SERVICE:  02/19/2019    PREOPERATIVE DIAGNOSIS:   1. Chronic rhinosinusitis including bilateral maxillary sinusitis, bilateral ethmoid sinusitis, bilateral frontal sinusitis, bilateral sphenoid sinusitis   2. Nasal obstruction secondary to nasal septal deviation and inferior turbinates hypertrophy   3. Bilateral nasal polyps   POSTOPERATIVE DIAGNOSIS:   1. Same    2. Nasal polyposis  3. Allergic fungal rhinosinusitis   NAME OF PROCEDURES:   1. Functional Endoscopic sinus surgery including bilateral middle meatal antrostomies with removal of tissue, bilateral total ethmoidectomies, bilateral frontal sinusotomies (bilateral Draf2a with propel contour), bilateral sphenoid sinusotomies  2. Use of intra operative navigation  3. Septoplasty  4. Left concha bullosa resection   SURGEON: Theodis Blaze MD    Resident: Lilly Cove, MD  ANESTHESIA: General via endotracheal tube.   OPERATIVE FINDINGS:   1. Fungal debris in all sinuses  2. Eosinophilic mucin in all sinuses    3. PosiSepX: Left side was irrigated with saline, right side with ciprodex.   SPECIMENS: Sinus tissue   IMPLANTS: None.   EBL: 923 ml  COMPLICATIONS: None.     INDICATIONS FOR PROCEDURE: This is a 49 y.o. male for chronic rhinosinusitis with no improvement on appropriate medical therapy.  Patient also complained of nasal obstruction secondary to nasal septal deviation and inferior turbinate hypertrophy. He failed medical treatment with steroid nasal sprays.     DESCRIPTION OF PROCEDURE: After patient identification and informed consent were verified in the preoperative holding area, the patient was brought back to the operating room. Anesthesia was induced and an endotracheal tube was placed. Stealth  navigation was placed and calibrated well. It was used throughout the case.   Both nasal cavities were packed with pledgets soaked with afrin.   Septal mucosa was injected with 1% lidocaine and 1 in 100,000 epinephrine.  The face was then prepped and draped in normal fashion for nasal and sinus surgery.  Right nasal cavity was 1st examined using 0 degree nasal endoscope.  Middle turbinate and uncinate process were identified.  Inferior turbinate was then outfractured.  We 1st started with a middle meatal antrostomy.  The uncinate process was removed using a seeker and then 45 degree Blakes Lea and Tru-Cut forceps.  I then used the xomed shaver to remove the rest of the uncinate process all the way superiorly. There was polyps and inflammed tissue in the maxillary sinus which were removed using xomed shaver.  Maxillary sinus was examined and suctioned and floor of the orbit was palpated.  We then proceeded with an anterior ethmoidectomy.  Face of the bulla was removed using J curette and the shaver.  Lamina papyracea was identified and preserved.  Basal lamella was then identified.  Posterior ethmoidectomy was then performed from posterior to anterior fashion.  Skull base was identified and skeletonized.  Lamina papyracea was also skeletonized.  Superior turbinate was then identified.  The lower 3rd was removed using 2 cut forceps.  Sphenoid ostium was identified and opened using mushroom punch and Kerrison Rogers.  Sphenoid sinus was widely opened and connected to the ethmoid cavity.  We then proceeded to the frontal sinus.  Frontal beak was identified.  Frontal sinus was then entered using 45 degree scope.  All the anterior ethmoid cells were cleaned completely.  The frontal sinus was examined.  The frontal sinus ostium was widened using a frontal sinus curette and Kuhn's instruments, and the 90 degree xomed shaver blade. Propel contour was placed.   Similar procedure was performed also on the left side with  similar findings. Left concha bullosa was also resected and opened.   Both ethmoid cavities were then draped with PosiSepX. Left side was irrigated with saline, right side with ciprodex.   Left hemi transfixation incision was then performed.  Septal flap was elevated all the way posteriorly and inferiorly using Laurence Ferrari and [employer].  BC junction was identified and divided and the right flap was elevated.  The deviation in the bone was resected by making a superior and inferior cut around the spur and the bone was resected using duckbill.  Further deviation superiorly was resected using Mosetta Pigeon forceps.  Further deviation in the maxillary crest was resected using straight osteotome.  There was still some deviation in the cartilage anteriorly which was corrected by removing a small piece of cartilage.  We left a good support but leaving and anterior and superior strut.   After that the septum was midline.   The hemi-transfixion incision was then closed using 4 0 plain suture on a curved needle.  Doyle splints were then placed and sutured to the membranous septum anteriorly using tube related suture on a Keith needle.    The head of bed was returned to anesthesia and the patient. He was awakened and extubated without difficulty and transferred to PACU in stable condition.     Please discharge patient home from PACU when criteria are met.      Koraima Albertsen A. Wilder Glade, MD  Associate professor  Otolaryngology-Head and Neck Surgery  [facility]

## 2019-02-19 NOTE — Discharge Instructions (Signed)
SURGICAL DISCHARGE INSTRUCTIONS     Dr. Makary, Chadi, MD  performed your ENDOSCOPIC SINUS SURGERY WITH NAVIGATION, SEPTOPLASTY today at the [facility]    [facility]:  Monday through Friday from 6 a.m. - 7 p.m.: [phone]  Between 7 p.m. - 6 a.m., weekends and holidays:  Call [facility] at [phone] or [phone].    PLEASE SEE WRITTEN HANDOUTS AS DISCUSSED BY YOUR NURSE:      SIGNS AND SYMPTOMS OF A WOUND / INCISION INFECTION   Be sure to watch for the following:  Increase in redness or red streaks near or around the wound or incision.  Increase in pain that is intense or severe and cannot be relieved by the pain medication that your doctor has given you.  Increase in swelling that cannot be relieved by elevation of a body part, or by applying ice, if permitted.  Increase in drainage, or if yellow / green in color and smells bad. This could be on a dressing or a cast.  Increase in fever for longer than 24 hours, or an increase that is higher than 101 degrees Fahrenheit (normal body temperature is 98 degrees Fahrenheit). The incision may feel warm to the touch.    **CALL YOUR DOCTOR IF ONE OR MORE OF THESE SIGNS / SYMPTOMS SHOULD OCCUR.    ANESTHESIA INFORMATION   ANESTHESIA -- ADULT PATIENTS:  You have received intravenous sedation / general anesthesia, and you may feel drowsy and light-headed for several hours. You may even experience some forgetfulness of the procedure. DO NOT DRIVE A MOTOR VEHICLE or perform any activity requiring complete alertness or coordination until you feel fully awake in about 24-48 hours. Do not drink alcoholic beverages for at least 24 hours. Do not stay alone, you must have a responsible adult available to be with you. You may also experience a dry mouth or nausea for 24 hours. This is a normal side effect and will disappear as the effects of the medication wear off.    REMEMBER   If you experience any difficulty breathing, chest pain, bleeding  that you feel is excessive, persistent nausea or vomiting or for any other concerns:  Call your physician Dr. Makary at [phone] or [phone]. You may also ask to have the ENT doctor on call paged. They are available to you 24 hours a day.    SPECIAL INSTRUCTIONS / COMMENTS   See handouts    FOLLOW-UP APPOINTMENTS   Please call patient services at [phone] or [phone] to schedule a date / time of return. They are open Monday - Friday from 7:30 am - 5:00 pm.

## 2019-02-19 NOTE — H&P (Signed)
HISTORY AND PHYSICAL    Current Date: 02/19/2019  Name: James Frost, 49 y.o. male  MRN: [id number]  Date of Birth: 1970/01/18  Date of Admission: 02/19/2019    Chief Complaint: CRS    History of Present Illness: This is a 49 y.o. male presenting for FESS. Denies any new issues.     Past Medical History:   Diagnosis Date   . Esophageal reflux    . Headache    . Hx of LASIK        Past Surgical History:   Procedure Laterality Date   . HX APPENDECTOMY     . HX CHOLECYSTECTOMY     . HX TONSILLECTOMY     . HX VASECTOMY     . HX WISDOM TEETH EXTRACTION     . LASIK         Medications:(Not in an outpatient encounter)  Allergies:No Known Allergies  Social History     Occupational History   . Not on file   Tobacco Use   . Smoking status: Current Every Day Smoker     Packs/day: 1.00     Years: 31.00     Pack years: 31.00     Types: Cigarettes   . Smokeless tobacco: Current User     Types: Chew   . Tobacco comment: Trying to quit   Vaping Use   . Vaping Use: Never used   Substance and Sexual Activity   . Alcohol use: Yes     Alcohol/week: 1.0 standard drinks     Types: 1 Cans of beer per week   . Drug use: Never   . Sexual activity: Not on file     Family Medical History:     Problem Relation (Age of Onset)    Cancer Paternal Grandfather    Diabetes Mother, Father, Maternal Grandmother          Review of Systems: Other than ROS in the HPI, all other systems were negative.    Physical Exam:  BP (!) 146/94   Pulse 80   Temp 36.5 C (97.7 F)   Resp (!) 23   Ht 1.803 m (5\' 11" )   Wt 103 kg (227 lb 8.2 oz)   SpO2 96%   BMI 31.73 kg/m       General Appearance: pleasant, cooperative, no distress  Eyes: Conjunctiva clear.  Head and Face: Facies symmetric, no obvious lesions.  Neurologic: grossly normal    Otolaryngology Specific Comorbid Conditions  - Will defer plan and management of comorbid conditions to primary service while providing recommendations where appropriate. Use for consults  - Will monitor comorbid  conditions while inpatient and consult appropriate services as necessary. Use for primary patients  - For complete past medical, surgical and social histories, please see relevant sections in H&P.   - Additional comorbid conditions include:  none    Pertinent labs and clinical data reviewed:   Nutritional status and Endocrine function:  . Facility age limit for growth percentiles is 20 years. peds only  . Body mass index is 31.73 kg/m.  Marland Kitchen Weight: 103 kg (227 lb 8.2 oz)  . Ideal body weight: 75.3 kg (166 lb 0.1 oz)  . Adjusted ideal body weight: 86.5 kg (190 lb 9.8 oz)  No results for input(s): ALBUMIN, PREALBUMIN in the last 4383 hours.  No results for input(s): TSH, T3, T4, INTACTPTH in the last 4383 hours. Hepatic function:  No results for input(s): TOTPROTEIN, TOTBILIRUBIN, AST, ALT, ALKPHOS, GAMMAGT, LDH,  AMYLASE, LIPASE, AMMONIA in the last 4383 hours.   CBC and Coag function:  No results for input(s): WBC, HGB, HCT, PLTCNT, APTT, INR in the last 4383 hours.  BMP and Renal function:  No results for input(s): SODIUM, POTASSIUM, CHLORIDE, CALCIUM, MAGNESIUM, PHOSPHORUS, BUN, CREATININE, GFR in the last 4383 hours.            Assessment: CRS    Plan: FESS, septoplasty      Kiamesha Samet A. Candelaria Celeste, MD  Associate professor  Otolaryngology-Head and Neck Surgery  [facility]

## 2019-02-20 NOTE — Anesthesia Postprocedure Evaluation (Signed)
Anesthesia Post Op Evaluation    Patient: James Frost  Procedure(s) with comments:  ENDOSCOPIC SINUS SURGERY WITH NAVIGATION - covid 2/8  SEPTOPLASTY    Last Vitals:Temperature: 36.7 C (98.1 F) (02/19/19 1457)  Heart Rate: 95 (02/19/19 1457)  BP (Non-Invasive): (!) 171/109 (02/19/19 1457)  Respiratory Rate: 16 (02/19/19 1457)  SpO2: 93 % (02/19/19 1457)    No complications documented.    Patient is sufficiently recovered from the effects of anesthesia to participate in the evaluation and has returned to their pre-procedure level.  Patient location during evaluation: PACU       Patient participation: complete - patient participated  Level of consciousness: awake and alert and responsive to verbal stimuli    Pain management: adequate  Airway patency: patent    Anesthetic complications: no  Cardiovascular status: acceptable  Respiratory status: acceptable  Hydration status: acceptable  Patient post-procedure temperature: Pt Normothermic   PONV Status: Absent

## 2019-02-22 LAB — OR CULTURE(AEROBIC AND GRAM STAIN)

## 2019-02-22 LAB — OR CULTURE (AEROBIC AND GRAM STAIN)

## 2019-02-25 ENCOUNTER — Ambulatory Visit: Payer: BC Managed Care – PPO | Attending: Otolaryngology | Admitting: Otolaryngology

## 2019-02-25 ENCOUNTER — Other Ambulatory Visit: Payer: Self-pay

## 2019-02-25 VITALS — Temp 98.0°F | Ht 71.0 in | Wt 239.2 lb

## 2019-02-25 DIAGNOSIS — J309 Allergic rhinitis, unspecified: Secondary | ICD-10-CM | POA: Insufficient documentation

## 2019-02-25 DIAGNOSIS — J329 Chronic sinusitis, unspecified: Secondary | ICD-10-CM

## 2019-02-25 DIAGNOSIS — Z48810 Encounter for surgical aftercare following surgery on the sense organs: Secondary | ICD-10-CM | POA: Insufficient documentation

## 2019-02-25 DIAGNOSIS — B49 Unspecified mycosis: Secondary | ICD-10-CM | POA: Insufficient documentation

## 2019-02-25 DIAGNOSIS — J339 Nasal polyp, unspecified: Secondary | ICD-10-CM | POA: Insufficient documentation

## 2019-02-25 DIAGNOSIS — J3089 Other allergic rhinitis: Secondary | ICD-10-CM | POA: Insufficient documentation

## 2019-02-25 DIAGNOSIS — J310 Chronic rhinitis: Secondary | ICD-10-CM

## 2019-02-25 MED ORDER — MONTELUKAST 10 MG TABLET
10.0000 mg | ORAL_TABLET | Freq: Every day | ORAL | 3 refills | Status: DC
Start: 2019-02-25 — End: 2021-04-28

## 2019-02-25 MED ORDER — FLUTICASONE PROPIONATE 50 MCG/ACTUATION NASAL SPRAY,SUSPENSION
2.0000 | Freq: Every day | NASAL | 5 refills | Status: DC
Start: 2019-02-25 — End: 2021-04-28

## 2019-02-25 NOTE — Progress Notes (Signed)
[facility]  OTOLARYNGOLOGY DEPARTMENT    Follow up    NAME:  James Frost  MRN:  [id number]  DOB:  1970-01-16  DOS:  02/25/2019    Subjective  James Frost is a 49 y.o. male who presents for follow up for Post Op. The patient is s/p functional endoscopic sinus surgery, septoplasty, and left concha bullosa resection on 02/19/2019. The patient complains of irritability due to the splints in his nose. He has been using nasal irrigations, twice daily, and is still taking Prednisone and Augmentin. Partner endorses snoring. Denies use of salt packets with nasal irrigation. Patient states he has solely been using saline during rinses. Denies allergy to Aspirin. Denies asthma or known allergies. Denies known sleep apnea. Denies use of Flonase or Singulair. Patient is a current, everyday smoker.     SNOT-22 score: 52    Objective    Physical Exam  Vitals:    02/25/19 1053   Temp: 36.7 C (98 F)   Weight: 109 kg (239 lb 3.2 oz)   Height: 1.803 m (5\' 11" )   BMI: 33.43       General Appearance: Pleasant, cooperative, healthy, and in no acute distress.  Eyes: Conjunctivae/corneas clear.  Head and Face: Normocephalic, atraumatic.  Face symmetric, no obvious lesions.   Nose:  See scope note. Doyle splints removed.   Skin: Skin warm and dry.  Neurologic: Cranial nerves:  grossly intact.  Psychiatric:  Alert and oriented x 3.      Procedure: rigid nasal endoscopy  Pre-operative Dx: CRSwNP, AFRS s/p FESS  Post-operative Dx: same    Description: bilateral side(s) of the nose anesthetized with Afrin/lidocaine.  A 30-degree rigid endoscope was passed into the nose with the following findings:  Septum: midline with no evidence of perforation or hematoma  Right  Inferior turbinate: healing well   Middle turbinate: in good medial position   Polyps: 0  Edema: 1  Drainage: 0  Scarring: 0  Crusting: 0  LK score: 1  NPS: 0    Left:  Inferior turbinate: healing well   Middle turbinate: in medial position. Small  amount of granulation tissue along the mucosa   Polyps: 0  Edema: 1  Drainage: 1  Scarring: 0  Crusting: 0  LK score: 2  NPS: 0     Debris and old blood clots were cleaned from the ethmoid and maxillary sinuses on both sides right and left using straight and curved suctions.  Frontal sinus recesses are open more so on right side.  Propel stents are in place.  Right-sided propel stent was removed with the suction.    The patient tolerated the procedure well.      Emanuelle Bastos A. 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  - Advised patient that he can return to work in x1 week. Provided patient a return to work slip.   - Prescribed Singulair.   - Prescribed Flonase, 1 spray bilaterally twice daily.   - Ordered AMB consult/REF ENT only--allergy testing  - Precautions: avoid blowing nose for x1 week  - Follow up in 2 weeks.       1. Chronic rhinosinusitis  2. Allergic rhinitis    3. Nasal polyposis    4. Allergic fungal sinusitis        Orders Placed This Encounter   . NASAL/SINUS ENDOSCOPY W/BIOPSY SURGICAL (BILATERAL-AMB ONLY)   . AMB CONSULT/REF ENT ONLY-ALLERGY TESTING   . fluticasone propionate (FLONASE) 50 mcg/actuation Nasal Spray, Suspension   . montelukast (SINGULAIR) 10 mg Oral Tablet       Rockland Kotarski A. Wilder Glade, MD  Associate Professor  Otolaryngology-Head and [place]    I am scribing for, and in the presence of, Dr. Wilder Glade for services provided on 02/25/2019.  Lyndee Leo Soucier, SCRIBE      I personally performed the services described in this documentation, as scribed  in my presence, and it is both accurate  and complete.    Theodis Blaze, MD      CC:    PCP Carma Leaven, MD  [address]  [place] [address]   Referring Provider No referring provider defined for this encounter.

## 2019-02-25 NOTE — Procedures (Signed)
Procedure: rigid nasal endoscopy  Pre-operative Dx: CRSwNP, AFRS s/p FESS  Post-operative Dx: same    Description: bilateral side(s) of the nose anesthetized with Afrin/lidocaine.  A 30-degree rigid endoscope was passed into the nose with the following findings:  Septum: midline with no evidence of perforation or hematoma  Right  Inferior turbinate: healing well   Middle turbinate: in good medial position   Polyps: 0  Edema: 1  Drainage: 0  Scarring: 0  Crusting: 0  LK score: 1  NPS: 0    Left:  Inferior turbinate: healing well   Middle turbinate: in medial position. Small amount of granulation tissue along the mucosa   Polyps: 0  Edema: 1  Drainage: 1  Scarring: 0  Crusting: 0  LK score: 2  NPS: 0     Debris and old blood clots were cleaned from the ethmoid and maxillary sinuses on both sides right and left using straight and curved suctions.  Frontal sinus recesses are open more so on right side.  Propel stents are in place.  Right-sided propel stent was removed with the suction.    The patient tolerated the procedure well.      Josselyn Harkins A. Candelaria Celeste, MD  Associate Professor  Otolaryngology-Head and Neck Surgery  [facility]

## 2019-02-26 LAB — SURGICAL PATHOLOGY SPECIMEN

## 2019-02-26 LAB — FUNGUS CULTURE

## 2019-02-26 LAB — ANAEROBIC CULTURE

## 2019-03-07 ENCOUNTER — Ambulatory Visit (INDEPENDENT_AMBULATORY_CARE_PROVIDER_SITE_OTHER): Payer: BC Managed Care – PPO

## 2019-03-07 ENCOUNTER — Other Ambulatory Visit: Payer: Self-pay

## 2019-03-07 ENCOUNTER — Ambulatory Visit (INDEPENDENT_AMBULATORY_CARE_PROVIDER_SITE_OTHER): Payer: BC Managed Care – PPO | Admitting: Otolaryngology

## 2019-03-07 ENCOUNTER — Encounter (INDEPENDENT_AMBULATORY_CARE_PROVIDER_SITE_OTHER): Payer: Self-pay | Admitting: Otolaryngology

## 2019-03-07 VITALS — BP 138/88 | HR 83 | Temp 97.1°F | Ht 71.0 in | Wt 231.7 lb

## 2019-03-07 DIAGNOSIS — B49 Unspecified mycosis: Secondary | ICD-10-CM

## 2019-03-07 DIAGNOSIS — Z91038 Other insect allergy status: Secondary | ICD-10-CM

## 2019-03-07 DIAGNOSIS — J329 Chronic sinusitis, unspecified: Secondary | ICD-10-CM

## 2019-03-07 DIAGNOSIS — J309 Allergic rhinitis, unspecified: Secondary | ICD-10-CM

## 2019-03-07 DIAGNOSIS — J3081 Allergic rhinitis due to animal (cat) (dog) hair and dander: Secondary | ICD-10-CM

## 2019-03-07 DIAGNOSIS — J301 Allergic rhinitis due to pollen: Secondary | ICD-10-CM

## 2019-03-07 DIAGNOSIS — Z9889 Other specified postprocedural states: Secondary | ICD-10-CM

## 2019-03-07 DIAGNOSIS — J339 Nasal polyp, unspecified: Secondary | ICD-10-CM

## 2019-03-07 DIAGNOSIS — J3089 Other allergic rhinitis: Secondary | ICD-10-CM

## 2019-03-07 NOTE — Progress Notes (Signed)
IDT Allergy and Testing  IDT Allergy Testing & Treatment 03/07/2019   Testing site 1 Left Side   Ragweed Dilution 6 13   TOTAL RAGWEED DILUTION (No Data)   Pigweed Dilution 2 5   Plantain Dilution 4 7   Plantain Dilution 3 7   Plantain Dilution 2 13   TOTAL PLANTAIN DILUTION @ 3   Lamb's Quarters Dilution 4 5   Lamb's Quarters Dilution 2 7   Lamb's Quarters Dilution 1 9   TOTAL LAMB'S QUARTERS DILUTION @ 2   Timothy Grass Dilution 7 13   TOTAL TIMOTHY GRASS DILUTION (No Data)   Guatemala Grass Dilution 2 5   Johnson Grass Dilution 4 7   Johnson Grass Dilution 2 13   TOTAL JOHNSON GRASS DILUTION @ 3   Red Oak  Dilution 2 7   Red Oak  Dilution 1 7   Maple Dilution 4 5   Maple Dilution 2 9   TOTAL MAPLE DILUTION @ 3   Red Birch Dilution 2 5   Elm Dilution 2 5   American Sycamore Dilution 2 7   American Sycamore Dilution 1 9   TOTAL AMERICAN SYCAMORE DILUTION   @ 2   Cat Dilution 6 7   Cat Dilution 5 7   Cat Dilution 4 11   TOTAL CAT DILUTION @ 5   Dog Dilution 4 5   Dog Dilution 2 7   Dog Dilution 1 9   TOTAL DOG DILUTION @ 2   Testing site 2 Right Side   Dustmite Farinae Dilution 6 5   Dustmite Farinae Dilution 4 13   TOTAL DUSTMITE FARINAE DILUTION @ 5   Dustmite Pteronyssinus Dilution 6 7   Dustmite Pteronyssinus Dilution 5 13   TOTAL DUSTMITE PTERONYSSINUS DILUTION @ 6   Aspergillus Dilution 6 7   Aspergillus Dilution 5 7   Aspergillus Dilution 4 13   TOTAL ASPERGILLUS  DILUTION @ 5   Alternaria Dilution 2 7   Alternaria Dilution 1 11   TOTAL ALTERNARIA DILUTION @ 2   Cladosporium Sphaerospermum Dilution 4 5   Cladosporium Sphaerospermum Dilution 2 13   TOTAL Cladosporium SphaerospermumDILUTION @ 3   Penicillium Dilution 6 7   Penicillium Dilution 4 9   TOTAL PENICILLIUM DILUTION @ 5   Cockroach Dilution 4 5   Cockroach Dilution 2 9   TOTAL COCKROACH DILUTION @ 3   Initials LE       Allergy Nurses Note  Allergy Nurses Notes 03/07/2019   Patient Denies Use Of Beta Blockers;Antihistamine;Steroids;Antidepressants              Prick Test Record  PRICK TEST RECORD 03/07/2019   Initials LE   Testing Site A Right forearm   Initial/Retest Initial   Histamine (mm) 9   Dustmite Farinae (mm) 7   Cat (mm) 11   Dog (mm) 7   Cockroach (mm) 5   Alternaria (mm) 0   Cladosporium Sphaerospermum (mm) 0   Saline (mm) 0   Testing Site B Right forearm   Initial/Retest Initial   Elm (mm) 0   Red Oak (mm) 0   American Sycamore (mm) 0   Timothy Grass (mm) 11   Johnson Grass (mm) 0   Ragweed (mm) 9   English Plantain (mm) 7   Dustmite Pteronyssinus (mm) 11   Testing Site C Left forearm   Initial/Retest Initial   Birch Red (mm) 0   Maple (mm) 7   Guatemala Grass (mm)  0   Lamb Quarters (mm) 5   Rough Pigweed (mm) 0   Bipolaris Sorokiniana (mm) 0   Aspergillus (mm) 9   Penicillium (mm) 4       I have reviewed the above allergy test results which were positive, and will pursue treatment accordingly.  Arnold Long, MD

## 2019-03-07 NOTE — Progress Notes (Signed)
[facility]  OTOLARYNGOLOGY DEPARTMENT    Follow up    NAME:  James Frost  MRN:  [id number]  DOB:  [birth year]  DOS:  03/07/2019    Subjective  James Frost is a 49 y.o. male who presents for follow up for 2nd postoperative visit.  He is s/p full FESS and septoplasty on February 18, 2018 for chronic rhinosinusitis with nasal polyposis and allergic fungal rhinosinusitis.  Doing very well.  He is on saline and budesonide rinses and Flonase.  He had allergy testing today which came back positive.    SNOT-22 score: 14    Objective    Physical Exam  Vitals:    03/07/19 1134   BP: 138/88   Pulse: 83   Temp: 36.2 C (97.1 F)   SpO2: 98%   Weight: 105 kg (231 lb 11.3 oz)   Height: 1.803 m (5\' 11" )   BMI: 32.38         General Appearance: Pleasant, cooperative, healthy, and in no acute distress.  Eyes: Conjunctivae/corneas clear,  Head and Face: Normocephalic, atraumatic.  Face symmetric, no obvious lesions.   Pinnae: Normal shape and position.   Nose:  External pyramid midline.  See scope note.  Psychiatric:  Alert and oriented x 3.    Procedure: rigid nasal endoscopy  Pre-operative Dx: CRSwNP, AFRS s/p FESS  Post-operative Dx: same    Description: bilateral side(s) of the nose anesthetized with Afrin/lidocaine.  A 30-degree rigid endoscope was passed into the nose with the following findings:  Septum: midline   Right and left:  Inferior turbinate: healed well   Middle turbinate: in good medial position   Polyps: 0  Edema: 1  Drainage: 0  Scarring: 0  Crusting: 0  LK score: 0  NPS: 0    Debris were cleaned from the right and left ethmoid sinuses using straight and curved suctions and biting forceps.   All sinuses are widely open including frontal sinuses.  Healing very well.    The patient tolerated the procedure well.      Rashena Dowling A. , MD  Associate Professor  Otolaryngology-Head and Neck Surgery  [facility]            Allergy testing performed today was reviewed.  Was  positive for multiple environmental allergens including mold and fungus.      ASESSMENT  s/p full FESS and septoplasty on February 18, 2018 for chronic rhinosinusitis with nasal polyposis and allergic fungal rhinosinusitis.  Doing very well.    PLAN:    Continue saline and budesonide rinses and Flonase  Start on allergy immunotherapy  Follow-up in 6 weeks    1. Chronic rhinosinusitis    2. Nasal polyposis    3. Allergic fungal sinusitis    4. S/P FESS (functional endoscopic sinus surgery)    5. Allergic rhinitis          Orders Placed This Encounter   . NASAL/SINUS ENDOSCOPY W/BIOPSY SURGICAL (BILATERAL-AMB ONLY)       Kaelyn Innocent A. February 20, 2018, MD  Associate Professor  Otolaryngology-Head and Neck Surgery  [facility]    CC:    PCP [facility], MD  [address]  [place]   Referring Provider No referring provider defined for this encounter.

## 2019-03-07 NOTE — Procedures (Signed)
Procedure: rigid nasal endoscopy  Pre-operative Dx: CRSwNP, AFRS s/p FESS  Post-operative Dx: same    Description: bilateral side(s) of the nose anesthetized with Afrin/lidocaine.  A 30-degree rigid endoscope was passed into the nose with the following findings:  Septum: midline   Right and left:  Inferior turbinate: healed well   Middle turbinate: in good medial position   Polyps: 0  Edema: 1  Drainage: 0  Scarring: 0  Crusting: 0  LK score: 0  NPS: 0    Debris were cleaned from the right and left ethmoid sinuses using straight and curved suctions and biting forceps.   All sinuses are widely open including frontal sinuses.  Healing very well.    The patient tolerated the procedure well.      Parveen Freehling A. Candelaria Celeste, MD  Associate Professor  Otolaryngology-Head and Neck Surgery  [facility]

## 2019-03-11 ENCOUNTER — Ambulatory Visit (INDEPENDENT_AMBULATORY_CARE_PROVIDER_SITE_OTHER): Payer: Self-pay | Admitting: Otolaryngology

## 2019-03-11 NOTE — Telephone Encounter (Signed)
Regarding: James Frost  ----- Message from Earlie Counts sent at 03/07/2019  3:03 PM EST -----  Patient was calling to confirm whetehr or not there were restrictions for him to return to work after having a procedure with Dr. Candelaria Celeste. He can be reached @ [phone], thank you.

## 2019-03-11 NOTE — Telephone Encounter (Signed)
Returned call- no answer/ no VM. 

## 2019-03-13 ENCOUNTER — Other Ambulatory Visit (INDEPENDENT_AMBULATORY_CARE_PROVIDER_SITE_OTHER): Payer: Self-pay | Admitting: Otolaryngology

## 2019-03-13 MED ORDER — EPINEPHRINE 0.3 MG/0.3 ML INJECTION, AUTO-INJECTOR
0.30 mg | AUTO-INJECTOR | Freq: Once | INTRAMUSCULAR | 2 refills | Status: DC | PRN
Start: 2019-03-13 — End: 2020-09-02

## 2019-03-13 NOTE — Telephone Encounter (Signed)
Spoke to patient. Asked if patient would be interested in starting injections. Patient agreeable. Informed patient of Epipen policy. Advised that a valid Epipen would need to be brought to every injections. Also advised that after every injection until concentration is met, patient will have to wait 20 minutes to ensure no negative reactions. Patient agreeable and would like to proceed. Will mix patient 03/18/2019

## 2019-03-17 ENCOUNTER — Other Ambulatory Visit (INDEPENDENT_AMBULATORY_CARE_PROVIDER_SITE_OTHER): Payer: Self-pay | Admitting: Otolaryngology

## 2019-03-17 DIAGNOSIS — J301 Allergic rhinitis due to pollen: Secondary | ICD-10-CM

## 2019-03-17 DIAGNOSIS — J3081 Allergic rhinitis due to animal (cat) (dog) hair and dander: Secondary | ICD-10-CM

## 2019-03-17 DIAGNOSIS — J3089 Other allergic rhinitis: Secondary | ICD-10-CM

## 2019-03-17 DIAGNOSIS — Z91038 Other insect allergy status: Secondary | ICD-10-CM

## 2019-03-18 ENCOUNTER — Other Ambulatory Visit (INDEPENDENT_AMBULATORY_CARE_PROVIDER_SITE_OTHER): Payer: BC Managed Care – PPO | Admitting: Otolaryngology

## 2019-03-18 DIAGNOSIS — J301 Allergic rhinitis due to pollen: Secondary | ICD-10-CM

## 2019-03-18 DIAGNOSIS — J3089 Other allergic rhinitis: Secondary | ICD-10-CM

## 2019-03-18 DIAGNOSIS — Z91038 Other insect allergy status: Secondary | ICD-10-CM

## 2019-03-18 DIAGNOSIS — J3081 Allergic rhinitis due to animal (cat) (dog) hair and dander: Secondary | ICD-10-CM

## 2019-03-20 ENCOUNTER — Ambulatory Visit (INDEPENDENT_AMBULATORY_CARE_PROVIDER_SITE_OTHER): Payer: BC Managed Care – PPO

## 2019-03-20 ENCOUNTER — Other Ambulatory Visit: Payer: Self-pay

## 2019-03-20 DIAGNOSIS — J3089 Other allergic rhinitis: Secondary | ICD-10-CM

## 2019-03-20 DIAGNOSIS — Z91038 Other insect allergy status: Secondary | ICD-10-CM

## 2019-03-20 DIAGNOSIS — J3081 Allergic rhinitis due to animal (cat) (dog) hair and dander: Secondary | ICD-10-CM

## 2019-03-20 NOTE — Nursing Note (Signed)
03/20/19 0900   Mixed Vials   Comments wheel test- 11 mm   Comments wheel test- 13 mm   Comments Not okay to proceed. Patient to get 0.05 ml at next injection.

## 2019-03-24 NOTE — Nursing Note (Signed)
03/18/19 0826   ENT Immunotherapy Vial Preparation Flowsheet   Allergy Test Date 03/07/19   Immunotherapy Start Date 03/20/19   Type of Immunotherapy? Subcutaneous   Managing Physician Makary   Initials cs   Vial #1   Preparation Date 03/18/19   Expiration Date 06/18/19   Immunotherapy Status No escalation   Diagnosis Allergic Rhinitis due to pollen (J30.1)   Ragweed, Short 1:20 W/V Lavella Hammock)   Initial Endpoint   (no endpoint)   Dilution (10% Glycerin) Used 6   Volume of Dilution Used 0.32ml   Plantain, English 1:20 W/V Lavella Hammock)   Initial Endpoint 3   Dilution (10% Glycerin) Used 1   Volume of Dilution Used 0.69ml   Lambs Quarter 1:20 W/V Lavella Hammock)   Initial Endpoint 2   Dilution (10% Glycerin) Used Concentrate   Volume of Dilution Used 0.69ml   Timothy Grass 100,000 BAU/ML Lavella Hammock)   Initial Endpoint   (no endpoint)   Dilution (10% Glycerin) Used 6   Volume of Dilution Used 0.60ml   Johnson Grass 1:20 W/V Lavella Hammock)   Initial Endpoint 4   Dilution (10% Glycerin) Used 2   Volume of Dilution Used 0.83ml   Box Elder 1:20 W/V Lavella Hammock)   Initial Endpoint 3   Dilution (10% Glycerin) Used 1    Volume of Dilution Used 0.50ml   Sycamore, American Eastern 1:20 W/V Lavella Hammock)   Initial Endpoint 2   Dilution (10% Glycerin) Used Concentrate   Volume of Dilution Used 0.69ml   OTHER   Antigen Volume 1.4   Diluent (Normal Saline) Volume 3.6   Total Volume 75ml   Antigen Volume 2   Diluent (Normal Saline) Volume 3   Total Volume 35ml   Vial #2   Preparation Date 03/18/19   Expiration Date 06/18/19   Immunotherapy Status No escalation   Diagnosis Allergic Rhinits due to dog (J30.81);Allergic Rhinitis due to cat (J30.81);Allergic Rhinitis due to dustmite (J30.89);Allergic Rhinitis due to mold (J30.89);Allergy to cockroach (Z91.038)   Cat Hair 10,000 BAU/ML Lavella Hammock)   Initial Endpoint 5   Dilution (10% Glycerin) Used 3   Volume of Dilution Used 0.67ml   Dog Epithelia 1:20 W/V Lavella Hammock)   Initial Endpoint 2   Dilution (10% Glycerin) Used Concentrate     Volume of Dilution Used 0.60ml   Dustmite Farinae  10,000 AU/ML (GREER)   Initial Endpoint 5    Dilution (10% Glycerin) Used 3   Volume of Dilution Used 0.74ml   Dustmite Pteronyssinus 10,000 AU/ML Lavella Hammock)   Initial Endpoint 6    Dilution (10% Glycerin) Used 4   Volume of Dilution Used 0.4ml   Aspergillus Fumigatus 1:20 W/V Lavella Hammock)    Initial Endpoint 5   Dilution (10% Glycerin) Used 3   Volume of Dilution Used 0.33ml   Alternaria 1:20 W/V Lavella Hammock)   Initial Endpoint 2    Dilution (10% Glycerin) Used Concentrate   Volume of Dilution Used 0.39ml   Cladosporium Sphaerospermum 1:20 W/V Lavella Hammock)   Initial Endpoint 3   Dilution (10% Glycerin) Used 1   Volume of Dilution Used 0.54ml   Penicillium Notatum 1:20 W/V Lavella Hammock)   Initial Endpoint 6   Dilution (10% Glycerin) Used 4   Volume of Dilution Used 0.63ml   Bipoloris Sorokiniana 1:20 W/V Lavella Hammock)    Initial Endpoint 2   Dilution (10% Glycerin) Used Concentrate   Volume of Dilution Used 0.3ml   Cockroach, American 1:20 W/V Lavella Hammock)   Initial Endpoint 3   Dilution (10% Glycerin) Used 1  Volume of Dilution Used 0.64ml   2 (10 unit) vials mixed.    Festus Holts, MD  03/25/2019, 09:56

## 2019-03-27 ENCOUNTER — Other Ambulatory Visit: Payer: Self-pay

## 2019-03-27 ENCOUNTER — Ambulatory Visit (INDEPENDENT_AMBULATORY_CARE_PROVIDER_SITE_OTHER): Payer: BC Managed Care – PPO

## 2019-03-27 DIAGNOSIS — J3081 Allergic rhinitis due to animal (cat) (dog) hair and dander: Secondary | ICD-10-CM

## 2019-03-27 DIAGNOSIS — Z91038 Other insect allergy status: Secondary | ICD-10-CM

## 2019-03-27 DIAGNOSIS — J3089 Other allergic rhinitis: Secondary | ICD-10-CM

## 2019-03-27 DIAGNOSIS — J301 Allergic rhinitis due to pollen: Secondary | ICD-10-CM

## 2019-03-27 NOTE — Nursing Note (Signed)
03/27/19 0805 03/27/19 0808   Allergy Injection Flowsheet   Date of Injection 03/27/19 03/27/19   Any Reaction to Previous Administration? No No   Any Illness/ Fever in Last 24 hours?* No No   Any Change in Medication? No No   Is the patient on a beta blocker? No No   Does the patient have a valid EpiPen at visit? Yes Yes   Does patient have a history of Asthma? 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 No No       Patient had wheal test at previous visit. 34mm and 7mm. Injection was held for at least 3 days. Now proceeding with 0.05.     Lelon Huh, APRN  03/27/2019, 08:56

## 2019-03-31 ENCOUNTER — Ambulatory Visit (INDEPENDENT_AMBULATORY_CARE_PROVIDER_SITE_OTHER): Payer: Self-pay

## 2019-03-31 NOTE — Telephone Encounter (Signed)
Spoke to patient fiance. She states that patient started injections 2 weeks ago. The first week, he had a vial safety test performed. Wheal was 63mm which was within normal limits, but per protocol, we wait 72 hours to administer injection if the patient has a 42mm reaction. Patient did not get an injection that day. Patient went back the following week for first injection from vial of 0.28ml. Patient had less than a nickle size reaction on each arm per fiance which is within normal limits. Patient did not note any systemic or local reaction that evening. The next day, he woke up with a sinus headache and green sinus drainage. She states that this persisted much of the weekend. He is still symptomatic today with facial pressure, headache, and green sinus drainage while doing his sinus rinses. I advised that normally reactions to the injections happen immediately following the injection or in the few hours following. Advised that I would route to Dr. Candelaria Celeste to advise.     She also states that since surgery, he still has a lot of residual pressure over the right eye which she has reported to the physician at past visits. She states that they were advised that this was just while the healing is taking place, but he is still having the same pressure.     She also would like to know if the patient could see the physician or if something could be called in for his sinus infection.    Message routed to provider to advise.

## 2019-03-31 NOTE — Telephone Encounter (Signed)
Regarding: Makary  ----- Message from Earlie Counts sent at 03/31/2019  9:40 AM EDT -----  Patient's fiance called and said that after getting his second allergy INJ he had some sort of reaction. The next day he woke up with severe sinus infection, with aches and pain. He is doing sinus flushes twice a day. Patient had previous surgery with Dr. Candelaria Celeste.  She was looking to speak with someone @ [phone], thank you.

## 2019-04-01 ENCOUNTER — Other Ambulatory Visit (FREE_STANDING_LABORATORY_FACILITY): Payer: Self-pay

## 2019-04-01 ENCOUNTER — Encounter (FREE_STANDING_LABORATORY_FACILITY): Admit: 2019-04-01 | Discharge: 2019-04-01 | Disposition: A | Discharge status: Home / Self Care | Payer: Self-pay

## 2019-04-01 NOTE — Telephone Encounter (Signed)
Arnold Long, MD  Waymond Cera, RN  Caller: Unspecified Burgess Estelle, 9:33 AM)  Alesia Banda     He can come in today or Friday. Be happy to see him and check him     Thanks       Spoke to [employer]. She states that patient persisted/ worsened last night. He went to the emergency room at [facility] where he was admitted for bilateral pneumoniae, low potassium levels, and low magnesium levels. He also has a sinus infection as well. Informed Dr. Candelaria Celeste of the above. Crystal will call back once he is discharged so we can schedule a follow up visit.

## 2019-04-02 LAB — COVID-19 ~~LOC~~ MOLECULAR LAB TESTING: SARS-CoV-2: NOT DETECTED

## 2019-04-02 LAB — AFB CULTURE WITH STAIN
AFB CULTURE: NO GROWTH
AFB SMEAR: NEGATIVE

## 2019-04-04 ENCOUNTER — Ambulatory Visit (INDEPENDENT_AMBULATORY_CARE_PROVIDER_SITE_OTHER): Payer: Self-pay

## 2019-04-11 ENCOUNTER — Encounter (HOSPITAL_BASED_OUTPATIENT_CLINIC_OR_DEPARTMENT_OTHER): Payer: BC Managed Care – PPO | Admitting: ORTHOPEDIC, SPORTS MEDICINE

## 2019-04-15 ENCOUNTER — Ambulatory Visit (INDEPENDENT_AMBULATORY_CARE_PROVIDER_SITE_OTHER): Payer: Self-pay

## 2019-04-15 NOTE — Telephone Encounter (Signed)
I attempted to call patient. No response. Message and callback number left on voicemail.

## 2019-04-18 ENCOUNTER — Ambulatory Visit: Payer: BC Managed Care – PPO | Attending: ORTHOPEDIC, SPORTS MEDICINE | Admitting: ORTHOPEDIC, SPORTS MEDICINE

## 2019-04-18 ENCOUNTER — Encounter (HOSPITAL_BASED_OUTPATIENT_CLINIC_OR_DEPARTMENT_OTHER): Payer: Self-pay | Admitting: ORTHOPEDIC, SPORTS MEDICINE

## 2019-04-18 ENCOUNTER — Other Ambulatory Visit: Payer: Self-pay

## 2019-04-18 VITALS — BP 134/82 | HR 67 | Temp 96.8°F

## 2019-04-18 DIAGNOSIS — S43431D Superior glenoid labrum lesion of right shoulder, subsequent encounter: Secondary | ICD-10-CM | POA: Insufficient documentation

## 2019-04-18 NOTE — Progress Notes (Signed)
[facility]           SPORTS MEDICINE, [place]  [address]  [place] [address]  [phone]    NAME: Rimas Gilham  MEDICAL RECORD [id number]  DATE OF BIRTH: 1970/10/01  DATE OF ENCOUNTER: 04/18/2019    CHIEF COMPLAINT:  Right shoulder pain    HISTORY OF PRESENT ILLNESS:  Jahaan Vanwagner is a 49 y.o. right hand-dominant male that presents to clinic today for evaluation of Right shoulder pain.  We diagnosed him with a SLAP tear in clinic and he underwent a corticosteroid injection injection intra-articularly with excellent diagnostic response to the injection.  Patient indicates that treatment has been durable.  Occasionally has some burning to the shoulders associated with sleeping on the arm wrong or prolonged overhead reaching.  Patient denies any radicular pain, numbness or tingling to the Right upper extremity.    PAST MEDICAL HISTORY  Past Medical History:   Diagnosis Date    Esophageal reflux     Headache     Hx of LASIK          PAST SURGICAL HISTORY  Past Surgical History:   Procedure Laterality Date    HX APPENDECTOMY      HX CHOLECYSTECTOMY      HX TONSILLECTOMY      HX VASECTOMY      HX WISDOM TEETH EXTRACTION      LASIK           BMI  There is no height or weight on file to calculate BMI.    MEDICATIONS  Current Outpatient Medications   Medication Sig    EPINEPHrine 0.3 mg/0.3 mL Injection Auto-Injector 0.3 mL (0.3 mg total) by IntraMUSCULAR route Once, as needed for up to 1 dose    fluticasone propionate (FLONASE) 50 mcg/actuation Nasal Spray, Suspension 2 Sprays by Each Nostril route Once a day    HYDROcodone-acetaminophen (NORCO) 5-325 mg Oral Tablet Take 1 Tablet by mouth Every 4 hours as needed    montelukast (SINGULAIR) 10 mg Oral Tablet Take 1 Tablet (10 mg total) by mouth Once a day    omeprazole (PRILOSEC) 20 mg Oral Capsule, Delayed Release(E.C.) 20 mg Once a day     predniSONE (DELTASONE) 20 mg Oral Tablet  Take 2 tablets (40 mg) by mouth daily for 3 days, then take 1.5 tablets (30 mg) by mouth daily for 4 days, then take 1 tablet (20 mg) by mouth daily for 15 days, then take 0.5 tablet (10 mg) by mouth daily for 5 days (Patient not taking: Reported on 04/18/2019)    sildenafiL, pulm.hypertension, (REVATIO) 20 mg Tablet TAKE 1 5 PILLS BY MOUTH AS NEEDED ONE HOUR PRIOR TO INTERCOURSE ON AN EMPTY STOMACH (NO MORE THAN 5 IN 24 HOURS)    zolpidem (AMBIEN) 5 mg Oral Tablet TAKE 1 TABLET BY MOUTH AT BEDTIME     ALLERGIES  No Known Allergies    SOCIAL HISTORY  Social History     Tobacco Use    Smoking status: Current Every Day Smoker     Packs/day: 1.00     Years: 31.00     Pack years: 31.00     Types: Cigarettes    Smokeless tobacco: Current User     Types: Chew    Tobacco comment: Trying to quit   Vaping Use    Vaping Use: Never used   Substance Use Topics    Alcohol use: Yes  Alcohol/week: 1.0 standard drinks     Types: 1 Cans of beer per week    Drug use: Never       FAMILY HISTORY  Family Medical History:     Problem Relation (Age of Onset)    Cancer Paternal Grandfather    Diabetes Mother, Father, Maternal Grandmother            No history of bleeding disorders or adverse reactions to anesthesia     REVIEW OF SYSTEMS  Musculoskeletal and neurologic systems reviewed per the HPI and all others negative except for those that are also listed on the intake form.    PHYSICAL EXAM  BP 134/82    Pulse 67    Temp 36 C (96.8 F)     Is alert and orient x3, no apparent distress, compliant with medical examination.  Upon inspection of the left shoulder, normal contours of the biceps and shoulder girdle.  Has mild tenderness palpation over the biceps.  Mild pain with Yergason's.  He has mild pain with O'Brien's testing.  He has full range of motion.  Rotator cuff strength testing is 5/5 throughout.  Otherwise motor and sensory intact, distal pulses intact right upper extremity.    ASSESSMENT: 49 y.o. male with a Right SLAP  tear with significantly improved symptoms, impingement syndrome.    PLAN:  Overall the patient is pleased.  He has watched his lifting ergonomics and feels that things are going in the right direction.  We indicate that if he should develop further symptoms as pertaining to the SLAP, he contact us via MyChart or phone to organize potential surgical intervention.  Brief we talked about biceps tenodesis and the post surgical implications.  Patient will follow up as needed.  If the patient has any questions or concerns, they will contact our office.      Drucilla Schmidt, MD

## 2019-04-24 ENCOUNTER — Other Ambulatory Visit: Payer: Self-pay

## 2019-04-24 ENCOUNTER — Ambulatory Visit (INDEPENDENT_AMBULATORY_CARE_PROVIDER_SITE_OTHER): Payer: BC Managed Care – PPO | Admitting: Otolaryngology

## 2019-04-24 VITALS — BP 146/88 | HR 79 | Temp 97.0°F | Ht 71.0 in | Wt 239.0 lb

## 2019-04-24 DIAGNOSIS — J339 Nasal polyp, unspecified: Secondary | ICD-10-CM

## 2019-04-24 DIAGNOSIS — J310 Chronic rhinitis: Secondary | ICD-10-CM

## 2019-04-24 DIAGNOSIS — J329 Chronic sinusitis, unspecified: Secondary | ICD-10-CM

## 2019-04-24 DIAGNOSIS — Z9889 Other specified postprocedural states: Secondary | ICD-10-CM

## 2019-04-24 DIAGNOSIS — J3089 Other allergic rhinitis: Secondary | ICD-10-CM

## 2019-04-24 DIAGNOSIS — B49 Unspecified mycosis: Secondary | ICD-10-CM

## 2019-04-24 MED ORDER — PREDNISONE 20 MG TABLET
ORAL_TABLET | ORAL | 0 refills | Status: DC
Start: 2019-04-24 — End: 2021-04-28

## 2019-04-24 NOTE — Procedures (Signed)
Procedure: rigid nasal endoscopy  Pre-operative Dx: CRSwNP, AFRS s/p FESS  Post-operative Dx: same    Description: bilateral side(s) of the nose anesthetized with Afrin/lidocaine.  A 30-degree rigid endoscope was passed into the nose with the following findings:  Septum: midline   Right  Inferior turbinate: normal   Middle turbinate: in medial position   Polyps: 0  Edema: 0  Drainage: 0  Scarring: 0  Crusting: 0  LK score: 0  NPS: 0    Left:  Inferior turbinate: congested   Middle turbinate: swollen mucosa   Polyps: 0  Edema: 2  Drainage: 2 thick eosinophilic mucin suctioned  Scarring: 0  Crusting: 0  LK score: 4  NPS: 0    Thick mucin drainage was suctioned left sphenoid, ethmoid and maxillary sinuses, using straight suction.      The patient tolerated the procedure well.      Jori Thrall A. Candelaria Celeste, MD  Associate Professor  Otolaryngology-Head and Neck Surgery  [facility]

## 2019-04-24 NOTE — Progress Notes (Signed)
[facility]  OTOLARYNGOLOGY DEPARTMENT    Follow up    NAME:  Blandon Offerdahl  MRN:  [id number]  DOB:  Feb 28, 1970  DOS:  04/24/2019    Subjective  Jonathandavid Kwan Shellhammer is a 49 y.o. male who presents for follow up for AFRS.  He is s/p full FESS 02/2019.  He is doing well.  He is using Flonase Singulair and budesonide saline rinses.  He started on immunotherapy however he got pneumonia after the 1st shot.  He and his wife are questioning if the shot made him get the pneumonia.  I do not think this is the case.    SNOT-22 score: 11    Objective    Physical Exam  Vitals:    04/24/19 1551   BP: (!) 146/88   Pulse: 79   Temp: 36.1 C (97 F)   SpO2: 98%   Weight: 108 kg (238 lb 15.7 oz)   Height: 1.803 m (5\' 11" )   BMI: 33.4         General Appearance: Pleasant, cooperative, healthy, and in no acute distress.  Eyes: Conjunctivae/corneas clear  Head and Face: Normocephalic, atraumatic.  Face symmetric, no obvious lesions.   Pinnae: Normal shape and position.   Nose:  External pyramid midline.  See scope note.  Psychiatric:  Alert and oriented x 3.    Procedure: rigid nasal endoscopy  Pre-operative Dx: CRSwNP, AFRS s/p FESS  Post-operative Dx: same    Description: bilateral side(s) of the nose anesthetized with Afrin/lidocaine.  A 30-degree rigid endoscope was passed into the nose with the following findings:  Septum: midline   Right  Inferior turbinate: normal   Middle turbinate: in medial position   Polyps: 0  Edema: 0  Drainage: 0  Scarring: 0  Crusting: 0  LK score: 0  NPS: 0    Left:  Inferior turbinate: congested   Middle turbinate: swollen mucosa   Polyps: 0  Edema: 2  Drainage: 2 thick eosinophilic mucin suctioned  Scarring: 0  Crusting: 0  LK score: 4  NPS: 0    Thick mucin drainage was suctioned left sphenoid, ethmoid and maxillary sinuses, using straight suction.      The patient tolerated the procedure well.      Mihailo Sage A. , MD  Associate Professor  Otolaryngology-Head and Neck  Surgery  [facility]            ASESSMENT      1. Chronic rhinosinusitis    2. Nasal polyposis    3. Allergic fungal sinusitis (AFS)    4. S/P FESS (functional endoscopic sinus surgery)      Continue Flonase Singulair budesonide rinses and restart immunotherapy for allergy at the lower dilution  Prednisone taper for a week  Follow-up in 4 weeks with me      Orders Placed This Encounter    NASAL/SINUS ENDOSCOPY W/BIOPSY SURGICAL (UNILATERAL-AMB ONLY)    predniSONE (DELTASONE) 20 mg Oral Tablet       Tyreece Gelles A. [facility], MD  Associate Professor  Otolaryngology-Head and Neck Surgery  [facility]    CC:    PCP [facility], MD  [address]  [place]   Referring Provider No referring provider defined for this encounter.

## 2019-04-28 ENCOUNTER — Other Ambulatory Visit (INDEPENDENT_AMBULATORY_CARE_PROVIDER_SITE_OTHER): Payer: Self-pay | Admitting: Otolaryngology

## 2019-04-28 ENCOUNTER — Ambulatory Visit (INDEPENDENT_AMBULATORY_CARE_PROVIDER_SITE_OTHER): Payer: BC Managed Care – PPO

## 2019-04-28 DIAGNOSIS — J3081 Allergic rhinitis due to animal (cat) (dog) hair and dander: Secondary | ICD-10-CM

## 2019-04-28 DIAGNOSIS — Z91038 Other insect allergy status: Secondary | ICD-10-CM

## 2019-04-28 DIAGNOSIS — J3089 Other allergic rhinitis: Secondary | ICD-10-CM

## 2019-04-29 ENCOUNTER — Other Ambulatory Visit (INDEPENDENT_AMBULATORY_CARE_PROVIDER_SITE_OTHER): Payer: BC Managed Care – PPO | Admitting: Otolaryngology

## 2019-04-29 ENCOUNTER — Encounter (INDEPENDENT_AMBULATORY_CARE_PROVIDER_SITE_OTHER): Payer: Self-pay

## 2019-04-29 ENCOUNTER — Ambulatory Visit (INDEPENDENT_AMBULATORY_CARE_PROVIDER_SITE_OTHER): Payer: Self-pay

## 2019-04-29 DIAGNOSIS — J3089 Other allergic rhinitis: Secondary | ICD-10-CM

## 2019-04-29 DIAGNOSIS — Z91038 Other insect allergy status: Secondary | ICD-10-CM

## 2019-04-29 DIAGNOSIS — J3081 Allergic rhinitis due to animal (cat) (dog) hair and dander: Secondary | ICD-10-CM

## 2019-04-29 NOTE — Telephone Encounter (Signed)
-----   Message from [facility] sent at 04/29/2019 10:32 AM EDT -----  ----- Message from Mallie Snooks sent at 04/29/2019 10:31 AM EDT -----  Alesia Banda, patient is returning your call.  He can be reached at [phone].  Thank you

## 2019-04-29 NOTE — Telephone Encounter (Signed)
Spoke to [employer]. He states that currently no side effects from allergy injection. Advised to call back with any issues.

## 2019-04-30 NOTE — Nursing Note (Signed)
04/28/19 0930 04/28/19 0931   Mixed Vials   Allergy Vial #1 04/28/19  --    Comments 68mm  --    Allergy Vial #2 04/30/19  --    Comments 74mm  --    Comments OK to proceed with injection  --    Allergy Injection Flowsheet   Date of Injection 04/28/19 04/28/19   Any Reaction to Previous Administration? No No   Any Illness/ Fever in Last 24 hours?* No No   Any Change in Medication? No No   Is the patient on a beta blocker? No No   Does the patient have a valid EpiPen at visit? Yes Yes   Does patient have a history of Asthma? No No   Expiration date 07/28/19 07/28/19   Dose 0.05cc 0.05cc   Maintenance Dose 0.50cc 0.50cc   Site Left arm Right arm   Patient tolerated procedure well yes yes   Allergy Diagnosis J30.1-allergic rhinitis due to pollen J30.89-allergic rhinitis due to dust mite;J30.89 allergic rhinitis due to mold;J30.81 allergic rhinitis due to dog;J30.81 alleric rhinitis due to cat;Z91.038-allergy to cockroach   Time given 0940 0940   Time read 1000 1000   Patient declined to wait 20 minutes No No   Managing Physician Makary Makary   Initials LE LE   Were the vials mailed? No No     I have reviewed the above allergy vial safety test, give injection.  Elnoria Howard, MD

## 2019-05-01 NOTE — Nursing Note (Signed)
04/29/19 1130   ENT Immunotherapy Vial Preparation Flowsheet   Allergy Test Date 03/07/19   Immunotherapy Start Date 03/20/19   Type of Immunotherapy? Subcutaneous   Managing Physician Makary   Initials cs   Vial #1   Preparation Date 04/29/19   Expiration Date 07/29/19   Immunotherapy Status Decrease   Diagnosis Allergic Rhinitis due to pollen (J30.1)   Ragweed, Short 1:20 W/V Waynette Buttery)   Initial Endpoint   (no endpoint)   Dilution (10% Glycerin) Used 6   Volume of Dilution Used 0.55ml   Plantain, English 1:20 W/V Waynette Buttery)   Initial Endpoint 3   Dilution (10% Glycerin) Used 3   Volume of Dilution Used 0.89ml   Lambs Quarter 1:20 W/V Waynette Buttery)   Initial Endpoint 2   Dilution (10% Glycerin) Used 2   Volume of Dilution Used 0.64ml   Timothy Grass 100,000 BAU/ML (Greer)   Initial Endpoint   (no endpoint)   Dilution (10% Glycerin) Used 6   Volume of Dilution Used 0.34ml   Johnson Grass 1:20 W/V Waynette Buttery)   Initial Endpoint 4   Dilution (10% Glycerin) Used 4   Volume of Dilution Used 0.82ml   Box Elder 1:20 W/V Waynette Buttery)   Initial Endpoint 3   Dilution (10% Glycerin) Used 3    Volume of Dilution Used 0.53ml   Sycamore, American Eastern 1:20 W/V Waynette Buttery)   Initial Endpoint 2   Dilution (10% Glycerin) Used Concentrate   Volume of Dilution Used 0.29ml   OTHER   Antigen Volume 1.4   Diluent (Normal Saline) Volume 3.6   Total Volume 61ml   Antigen Volume 2   Diluent (Normal Saline) Volume 3   Total Volume 30ml   Vial #2   Preparation Date 04/29/19   Expiration Date 07/29/19   Immunotherapy Status Decrease   Diagnosis Allergic Rhinitis due to cat (J30.81);Allergic Rhinits due to dog (J30.81);Allergic Rhinitis due to dustmite (J30.89);Allergic Rhinitis due to mold (J30.89);Allergy to cockroach (Z91.038)   Cat Hair 10,000 BAU/ML Waynette Buttery)   Initial Endpoint 5   Dilution (10% Glycerin) Used 5   Volume of Dilution Used 0.36ml   Dog Epithelia 1:20 W/V Waynette Buttery)   Initial Endpoint 2   Dilution (10% Glycerin) Used 2   Volume of Dilution Used  0.64ml   Dustmite Farinae  10,000 AU/ML (GREER)   Initial Endpoint 5    Dilution (10% Glycerin) Used 5   Volume of Dilution Used 0.63ml   Dustmite Pteronyssinus 10,000 AU/ML Waynette Buttery)   Initial Endpoint 6    Dilution (10% Glycerin) Used 6   Volume of Dilution Used 0.7ml   Aspergillus Fumigatus 1:20 W/V Waynette Buttery)    Initial Endpoint 5   Dilution (10% Glycerin) Used 5   Volume of Dilution Used 0.7ml   Alternaria 1:20 W/V Waynette Buttery)   Initial Endpoint 2    Dilution (10% Glycerin) Used 2   Volume of Dilution Used 0.65ml   Cladosporium Sphaerospermum 1:20 W/V Waynette Buttery)   Initial Endpoint 3   Dilution (10% Glycerin) Used 3   Volume of Dilution Used 0.5ml   Penicillium Notatum 1:20 W/V Waynette Buttery)   Initial Endpoint 6   Dilution (10% Glycerin) Used 6   Volume of Dilution Used 0.69ml   Bipoloris Sorokiniana 1:20 W/V Waynette Buttery)    Initial Endpoint 2   Dilution (10% Glycerin) Used 2   Volume of Dilution Used 0.38ml   Cockroach, American 1:20 W/V Waynette Buttery)   Initial Endpoint 3   Dilution (10% Glycerin) Used 3   Volume of  Dilution Used 0.66ml   2 (10 unit) vials mixed.

## 2019-05-05 ENCOUNTER — Ambulatory Visit (INDEPENDENT_AMBULATORY_CARE_PROVIDER_SITE_OTHER): Payer: BC Managed Care – PPO

## 2019-05-05 DIAGNOSIS — J3089 Other allergic rhinitis: Secondary | ICD-10-CM

## 2019-05-05 DIAGNOSIS — Z91038 Other insect allergy status: Secondary | ICD-10-CM

## 2019-05-05 DIAGNOSIS — J3081 Allergic rhinitis due to animal (cat) (dog) hair and dander: Secondary | ICD-10-CM

## 2019-05-05 NOTE — Nursing Note (Signed)
05/05/19 0800 05/05/19 0846   Allergy Injection Flowsheet   Date of Injection 05/05/19 05/05/19   Any Reaction to Previous Administration? No No   Any Illness/ Fever in Last 24 hours?* No No   Any Change in Medication? No No   Is the patient on a beta blocker? No No   Does the patient have a valid EpiPen at visit? Yes Yes   Does patient have a history of Asthma? No No   Expiration date 07/28/19 07/28/19   Dose 0.10ml 0.59ml   Maintenance Dose 0.70ml 0.80ml   Site Left arm Right arm   Patient tolerated procedure well yes yes   Allergy Diagnosis J30.1-allergic rhinitis due to pollen J30.89-allergic rhinitis due to dust mite;J30.89 allergic rhinitis due to mold;J30.81 alleric rhinitis due to cat;J30.81 allergic rhinitis due to dog;Z91.038-allergy to cockroach   Time given 0847 0847   Time read 0907 0907   Patient declined to wait 20 minutes No No   Managing Physician [facility]   Initials jc jc   Were the vials mailed? No No   VIAL   Comments Epi Exp;  11/2020  --

## 2019-05-14 ENCOUNTER — Ambulatory Visit (INDEPENDENT_AMBULATORY_CARE_PROVIDER_SITE_OTHER): Payer: BC Managed Care – PPO

## 2019-05-14 ENCOUNTER — Other Ambulatory Visit: Payer: Self-pay

## 2019-05-14 DIAGNOSIS — J3089 Other allergic rhinitis: Secondary | ICD-10-CM

## 2019-05-14 DIAGNOSIS — J3081 Allergic rhinitis due to animal (cat) (dog) hair and dander: Secondary | ICD-10-CM

## 2019-05-14 DIAGNOSIS — Z91038 Other insect allergy status: Secondary | ICD-10-CM

## 2019-05-14 DIAGNOSIS — J301 Allergic rhinitis due to pollen: Secondary | ICD-10-CM

## 2019-05-14 NOTE — Nursing Note (Signed)
05/14/19 0851 05/14/19 0852   Allergy Injection Flowsheet   Date of Injection 05/14/19 05/14/19   Any Reaction to Previous Administration? No No   Any Illness/ Fever in Last 24 hours?* No No   Any Change in Medication? No No   Is the patient on a beta blocker? No No   Does the patient have a valid EpiPen at visit? Yes Yes   Does patient have a history of Asthma? 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 No No

## 2019-05-21 ENCOUNTER — Ambulatory Visit (INDEPENDENT_AMBULATORY_CARE_PROVIDER_SITE_OTHER): Payer: BC Managed Care – PPO

## 2019-05-21 ENCOUNTER — Other Ambulatory Visit: Payer: Self-pay

## 2019-05-21 DIAGNOSIS — J3081 Allergic rhinitis due to animal (cat) (dog) hair and dander: Secondary | ICD-10-CM

## 2019-05-21 DIAGNOSIS — J3089 Other allergic rhinitis: Secondary | ICD-10-CM

## 2019-05-21 DIAGNOSIS — Z91038 Other insect allergy status: Secondary | ICD-10-CM

## 2019-05-21 NOTE — Nursing Note (Signed)
05/21/19 0858 05/21/19 0901   Allergy Injection Flowsheet   Date of Injection 05/21/19 05/21/19   Any Reaction to Previous Administration? No No   Any Illness/ Fever in Last 24 hours?* No No   Any Change in Medication? No No   Is the patient on a beta blocker? No No   Does the patient have a valid EpiPen at visit? Yes Yes   Does patient have a history of Asthma? No No   Expiration date 07/28/19 07/28/19   Dose 0.20 ml 0.20 ml   Maintenance Dose 0.50 ml 0.50 ml   Site Right arm Left arm   Patient tolerated procedure well yes yes   Allergy Diagnosis J30.89-allergic rhinitis due to dust mite;J30.89 allergic rhinitis due to mold;J30.81 alleric rhinitis due to cat;J30.81 allergic rhinitis due to dog;Z91.038-allergy to cockroach J30.1-allergic rhinitis due to pollen   Time given 0900 0900   Time read 0920 0920   Patient declined to wait 20 minutes No No   Managing Physician Jesse Fall   Initials jg jg   Were the vials mailed? No No

## 2019-05-28 ENCOUNTER — Ambulatory Visit (INDEPENDENT_AMBULATORY_CARE_PROVIDER_SITE_OTHER): Payer: BC Managed Care – PPO

## 2019-05-28 ENCOUNTER — Other Ambulatory Visit: Payer: Self-pay

## 2019-05-28 DIAGNOSIS — J3081 Allergic rhinitis due to animal (cat) (dog) hair and dander: Secondary | ICD-10-CM

## 2019-05-28 DIAGNOSIS — J3089 Other allergic rhinitis: Secondary | ICD-10-CM

## 2019-05-28 DIAGNOSIS — Z91038 Other insect allergy status: Secondary | ICD-10-CM

## 2019-05-28 NOTE — Nursing Note (Signed)
05/28/19 0804 05/28/19 0808   Allergy Injection Flowsheet   Date of Injection 05/28/19 05/28/19   Any Reaction to Previous Administration? No No   Any Illness/ Fever in Last 24 hours?* No No   Any Change in Medication? No No   Is the patient on a beta blocker? No No   Does the patient have a valid EpiPen at visit? Yes Yes   Does patient have a history of Asthma? No No   Expiration date 07/28/19 07/28/19   Dose 0.25 ml 0.25 ml   Maintenance Dose 0.50 ml 0.50 ml   Site Right arm Left arm   Patient tolerated procedure well yes yes   Allergy Diagnosis J30.89-allergic rhinitis due to dust mite;J30.81 alleric rhinitis due to cat;J30.89 allergic rhinitis due to mold;J30.81 allergic rhinitis due to dog;Z91.038-allergy to cockroach J30.1-allergic rhinitis due to pollen   Time given 0808 0808   Time read 7782 0828   Patient declined to wait 20 minutes No No   Managing Physician Jesse Fall   Initials jg jg   Were the vials mailed? No No

## 2019-06-03 ENCOUNTER — Ambulatory Visit: Payer: BC Managed Care – PPO | Attending: Otolaryngology | Admitting: Otolaryngology

## 2019-06-03 ENCOUNTER — Other Ambulatory Visit: Payer: Self-pay

## 2019-06-03 VITALS — Temp 97.2°F | Ht 71.0 in | Wt 230.4 lb

## 2019-06-03 DIAGNOSIS — J309 Allergic rhinitis, unspecified: Secondary | ICD-10-CM

## 2019-06-03 DIAGNOSIS — J329 Chronic sinusitis, unspecified: Secondary | ICD-10-CM | POA: Insufficient documentation

## 2019-06-03 DIAGNOSIS — J3089 Other allergic rhinitis: Secondary | ICD-10-CM

## 2019-06-03 DIAGNOSIS — Z9889 Other specified postprocedural states: Secondary | ICD-10-CM

## 2019-06-03 DIAGNOSIS — B49 Unspecified mycosis: Secondary | ICD-10-CM

## 2019-06-03 DIAGNOSIS — J339 Nasal polyp, unspecified: Secondary | ICD-10-CM | POA: Insufficient documentation

## 2019-06-03 NOTE — Procedures (Signed)
Procedure: rigid nasal endoscopy  Pre-operative Dx: CRSwNP, AFRS s/p FESS  Post-operative Dx: same    Description: bilateral side(s) of the nose anesthetized with Afrin/lidocaine.  A 30-degree rigid endoscope was passed into the nose with the following findings:  Septum: midline   Right and left:  Inferior turbinate: normal   Middle turbinate: in good medial position   Polyps: 0  Edema: 0  Drainage: 0  Scarring: 0  Crusting: 0  LK score: 0  NPS: 0    Nasopharynx: no obstruction or mass lesions    The patient tolerated the procedure well.      Marque Bango A. Candelaria Celeste, MD  Associate Professor  Otolaryngology-Head and Neck Surgery  [facility]

## 2019-06-03 NOTE — Progress Notes (Signed)
[facility]  OTOLARYNGOLOGY DEPARTMENT    Follow up    NAME:  James Frost  MRN:  [id number]  DOB:  10/26/1970  DOS:  06/03/2019    Subjective  James Frost is a 49 y.o. 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 At that time, doing well. Today he reports that he is doing well with Flonase, Budesonide and Singulair. He also continues immunotherapy with no issues of local reactions. No significant complaints today.     SNOT-22 score: 5    Objective    Physical Exam  Vitals:    06/03/19 1355   Temp: 36.2 C (97.2 F)   TempSrc: Thermal Scan   Weight: 104 kg (230 lb 6.1 oz)   Height: 1.803 m (5\' 11" )   BMI: 32.2     General Appearance: Pleasant, cooperative, healthy, and in no acute distress.  Eyes: Conjunctivae/corneas clear, EOM's intact.  Head and Face: Normocephalic, atraumatic.  Face symmetric, no obvious lesions.   Nose: See scope note.  Oral Cavity/Oropharynx: No mucosal lesions, masses, or pharyngeal asymmetry.  Skin: Skin warm and dry.  Neurologic: Cranial nerves:  grossly intact.  Psychiatric:  Alert and oriented x 3.    Procedure: rigid nasal endoscopy  Pre-operative Dx: CRSwNP, AFRS s/p FESS  Post-operative Dx: same    Description: bilateral side(s) of the nose anesthetized with Afrin/lidocaine.  A 30-degree rigid endoscope was passed into the nose with the following findings:  Septum: midline   Right and left:  Inferior turbinate: normal   Middle turbinate: in good medial position   Polyps: 0  Edema: 0  Drainage: 0  Scarring: 0  Crusting: 0  LK score: 0  NPS: 0    Nasopharynx: no obstruction or mass lesions    The patient tolerated the procedure well.      Hollye Pritt A. , MD  Associate Professor  Otolaryngology-Head and Neck Surgery  [facility]            ASSESSMENT  Encounter Diagnoses   Name Primary?   . Chronic rhinosinusitis Yes   . Nasal polyposis    . Allergic fungal sinusitis    . Allergic rhinitis    .  S/P FESS (functional endoscopic sinus surgery)      PLAN:    Orders Placed This Encounter   . NASAL ENDOSCOPY DIAGNOSTIC-RIGID (AMB ONLY)   1. Patient underwent Rigid Nasal Endoscopy today in clinic. Patient tolerated this well.  2. Continue immunotherapy as planned. Continue Flonase, Singulair and Budesonide rinses as previously instructed.   3. Return to clinic in 3 months, sooner if needed.     I am scribing for, and in the presence of, [facility]. [facility] for services provided on 06/03/2019.  Damaris 06/05/2019, SCRIBE     I personally performed the services described in this documentation, as scribed  in my presence, and it is both accurate  and complete.    Alvester Morin, MD    Donielle Radziewicz A. Arnold Long, MD  Associate Professor  Otolaryngology-Head and Neck Surgery  [facility]    CC:    PCP [facility], MD  [address]  [place]   Referring Provider No referring provider defined for this encounter.

## 2019-06-04 ENCOUNTER — Ambulatory Visit (INDEPENDENT_AMBULATORY_CARE_PROVIDER_SITE_OTHER): Payer: BC Managed Care – PPO

## 2019-06-04 DIAGNOSIS — J301 Allergic rhinitis due to pollen: Secondary | ICD-10-CM

## 2019-06-04 DIAGNOSIS — Z91038 Other insect allergy status: Secondary | ICD-10-CM

## 2019-06-04 DIAGNOSIS — J3081 Allergic rhinitis due to animal (cat) (dog) hair and dander: Secondary | ICD-10-CM

## 2019-06-04 DIAGNOSIS — J3089 Other allergic rhinitis: Secondary | ICD-10-CM

## 2019-06-04 NOTE — Nursing Note (Signed)
06/04/19 0914 06/04/19 0915   Allergy Injection Flowsheet   Date of Injection 06/04/19 06/04/19   Any Reaction to Previous Administration? No No   Any Illness/ Fever in Last 24 hours?* No No   Any Change in Medication? No No   Is the patient on a beta blocker? No No   Does the patient have a valid EpiPen at visit? Yes Yes   Does patient have a history of Asthma? No No   Expiration date 07/28/19 07/28/19   Dose 0.63ml 0.6ml   Maintenance Dose 0.56ml 0.59ml   Site Right arm Left arm   Patient tolerated procedure well yes yes   Allergy Diagnosis J30.89-allergic rhinitis due to dust mite;J30.89 allergic rhinitis due to mold;J30.81 alleric rhinitis due to cat;J30.81 allergic rhinitis due to dog;Z91.038-allergy to cockroach J30.1-allergic rhinitis due to pollen   Time given 0914 0915   Time read 0934 0935   Patient declined to wait 20 minutes No No   Managing Physician Jesse Fall   Initials sc sc   Were the vials mailed? No No

## 2019-06-11 ENCOUNTER — Other Ambulatory Visit: Payer: Self-pay

## 2019-06-11 ENCOUNTER — Ambulatory Visit (INDEPENDENT_AMBULATORY_CARE_PROVIDER_SITE_OTHER): Payer: BC Managed Care – PPO

## 2019-06-11 DIAGNOSIS — J301 Allergic rhinitis due to pollen: Secondary | ICD-10-CM

## 2019-06-11 DIAGNOSIS — J3089 Other allergic rhinitis: Secondary | ICD-10-CM

## 2019-06-11 DIAGNOSIS — Z91038 Other insect allergy status: Secondary | ICD-10-CM

## 2019-06-11 DIAGNOSIS — J3081 Allergic rhinitis due to animal (cat) (dog) hair and dander: Secondary | ICD-10-CM

## 2019-06-11 NOTE — Nursing Note (Signed)
06/11/19 0930 06/11/19 0932   Allergy Injection Flowsheet   Date of Injection 06/11/19 06/11/19   Any Reaction to Previous Administration? No No   Any Illness/ Fever in Last 24 hours?* No No   Any Change in Medication? No No   Is the patient on a beta blocker? No No   Does the patient have a valid EpiPen at visit? Yes Yes   Does patient have a history of Asthma? No No   Expiration date 07/28/19 07/28/19   Dose 0.35 ml 0.35 ml   Maintenance Dose 0.50 ml 0.50 ml   Site Right arm Left arm   Patient tolerated procedure well yes yes   Allergy Diagnosis J30.89-allergic rhinitis due to dust mite;J30.89 allergic rhinitis due to mold;J30.81 alleric rhinitis due to cat;J30.81 allergic rhinitis due to dog;Z91.038-allergy to cockroach J30.1-allergic rhinitis due to pollen   Time given 0932 0932   Time read [phone]   Patient declined to wait 20 minutes No No   Managing Physician Jesse Fall   Initials jg jg   Were the vials mailed? No No

## 2019-06-17 ENCOUNTER — Ambulatory Visit (INDEPENDENT_AMBULATORY_CARE_PROVIDER_SITE_OTHER): Payer: Self-pay

## 2019-06-24 ENCOUNTER — Other Ambulatory Visit: Payer: Self-pay

## 2019-06-24 ENCOUNTER — Ambulatory Visit (INDEPENDENT_AMBULATORY_CARE_PROVIDER_SITE_OTHER): Payer: BC Managed Care – PPO

## 2019-06-24 DIAGNOSIS — Z91038 Other insect allergy status: Secondary | ICD-10-CM

## 2019-06-24 DIAGNOSIS — J3081 Allergic rhinitis due to animal (cat) (dog) hair and dander: Secondary | ICD-10-CM

## 2019-06-24 DIAGNOSIS — J3089 Other allergic rhinitis: Secondary | ICD-10-CM

## 2019-06-24 NOTE — Nursing Note (Signed)
06/24/19 0901 06/24/19 0902   Allergy Injection Flowsheet   Date of Injection 06/24/19 06/24/19   Any Reaction to Previous Administration? No No   Any Illness/ Fever in Last 24 hours?* No No   Any Change in Medication? No No   Is the patient on a beta blocker? No No   Does the patient have a valid EpiPen at visit? Yes Yes   Does patient have a history of Asthma? No No   Expiration date 07/28/19 07/28/19   Dose 0.14ml 0.27ml   Maintenance Dose 0.69ml 0.29ml   Site Right arm Left arm   Patient tolerated procedure well yes yes   Allergy Diagnosis J30.89-allergic rhinitis due to dust mite;J30.89 allergic rhinitis due to mold;J30.81 allergic rhinitis due to dog;J30.81 alleric rhinitis due to cat;Z91.038-allergy to cockroach J30.1-allergic rhinitis due to pollen   Time given 0902 0902   Time read [phone]   Patient declined to wait 20 minutes No No   Managing Physician Jesse Fall   Initials sc sc   Were the vials mailed? No No

## 2019-07-01 ENCOUNTER — Ambulatory Visit (INDEPENDENT_AMBULATORY_CARE_PROVIDER_SITE_OTHER): Payer: BC Managed Care – PPO

## 2019-07-01 ENCOUNTER — Other Ambulatory Visit: Payer: Self-pay

## 2019-07-01 DIAGNOSIS — J301 Allergic rhinitis due to pollen: Secondary | ICD-10-CM

## 2019-07-01 DIAGNOSIS — Z91038 Other insect allergy status: Secondary | ICD-10-CM

## 2019-07-01 DIAGNOSIS — J3089 Other allergic rhinitis: Secondary | ICD-10-CM

## 2019-07-01 DIAGNOSIS — J3081 Allergic rhinitis due to animal (cat) (dog) hair and dander: Secondary | ICD-10-CM

## 2019-07-01 NOTE — Nursing Note (Signed)
07/01/19 0857 07/01/19 0858   Allergy Injection Flowsheet   Date of Injection 07/01/19 07/01/19   Any Reaction to Previous Administration? No No   Any Illness/ Fever in Last 24 hours?* No No   Any Change in Medication? No No   Is the patient on a beta blocker? No No   Does the patient have a valid EpiPen at visit? Yes Yes   Does patient have a history of Asthma? No No   Expiration date 07/28/19 07/28/19   Dose 0.45 ml 0.45 ml   Maintenance Dose 0.50 ml 0.50 ml   Site Right arm Left arm   Patient tolerated procedure well yes yes   Allergy Diagnosis J30.89-allergic rhinitis due to dust mite;J30.89 allergic rhinitis due to mold;J30.81 alleric rhinitis due to cat;J30.81 allergic rhinitis due to dog;Z91.038-allergy to cockroach J30.1-allergic rhinitis due to pollen   Time given 0858 0858   Time read [phone]   Patient declined to wait 20 minutes No No   Managing Physician Jesse Fall   Initials sc sc   Were the vials mailed? No No

## 2019-07-07 ENCOUNTER — Other Ambulatory Visit: Payer: Self-pay

## 2019-07-07 ENCOUNTER — Ambulatory Visit (INDEPENDENT_AMBULATORY_CARE_PROVIDER_SITE_OTHER): Payer: BC Managed Care – PPO

## 2019-07-07 DIAGNOSIS — J3081 Allergic rhinitis due to animal (cat) (dog) hair and dander: Secondary | ICD-10-CM

## 2019-07-07 DIAGNOSIS — Z91038 Other insect allergy status: Secondary | ICD-10-CM

## 2019-07-07 DIAGNOSIS — J3089 Other allergic rhinitis: Secondary | ICD-10-CM

## 2019-07-07 NOTE — Nursing Note (Signed)
07/07/19 0918 07/07/19 0921   Allergy Injection Flowsheet   Date of Injection 07/07/19 07/07/19   Any Reaction to Previous Administration? No No   Any Illness/ Fever in Last 24 hours?* No No   Any Change in Medication? No No   Is the patient on a beta blocker? No No   Does the patient have a valid EpiPen at visit? Yes Yes   Does patient have a history of Asthma? No No   Expiration date 07/28/19 07/28/19   Dose 0.50 ml 0.50 ml   Maintenance Dose 0.50 ml 0.50 ml   Site Right arm Left arm   Patient tolerated procedure well yes yes   Allergy Diagnosis J30.81 alleric rhinitis due to cat;J30.81 allergic rhinitis due to dog;J30.89-allergic rhinitis due to dust mite;J30.89 allergic rhinitis due to mold;Z91.038-allergy to cockroach J30.1-allergic rhinitis due to pollen   Time given 0920 0920   Time read 0940 0940   Patient declined to wait 20 minutes No No   Managing Physician Jesse Fall   Initials jg jg   Were the vials mailed? No No

## 2019-07-08 ENCOUNTER — Other Ambulatory Visit (INDEPENDENT_AMBULATORY_CARE_PROVIDER_SITE_OTHER): Payer: BC Managed Care – PPO | Admitting: Otolaryngology

## 2019-07-08 ENCOUNTER — Other Ambulatory Visit (INDEPENDENT_AMBULATORY_CARE_PROVIDER_SITE_OTHER): Payer: Self-pay | Admitting: Otolaryngology

## 2019-07-08 DIAGNOSIS — J3081 Allergic rhinitis due to animal (cat) (dog) hair and dander: Secondary | ICD-10-CM

## 2019-07-08 DIAGNOSIS — J3089 Other allergic rhinitis: Secondary | ICD-10-CM

## 2019-07-08 DIAGNOSIS — Z91038 Other insect allergy status: Secondary | ICD-10-CM

## 2019-07-08 DIAGNOSIS — J301 Allergic rhinitis due to pollen: Secondary | ICD-10-CM

## 2019-07-10 NOTE — Nursing Note (Cosign Needed)
07/08/19 0935   ENT Immunotherapy Vial Preparation Flowsheet   Allergy Test Date 03/07/19   Immunotherapy Start Date 03/20/19   Type of Immunotherapy? Subcutaneous   Managing Physician Makary   Initials cs   Vial #1   Preparation Date 07/08/19   Expiration Date 10/08/19   Immunotherapy Status Increase   Diagnosis Allergic Rhinitis due to pollen (J30.1)   Ragweed, Short 1:20 W/V Waynette Buttery)   Initial Endpoint   (no endpoint)   Dilution (10% Glycerin) Used 5   Volume of Dilution Used 0.20ml   Plantain, English 1:20 W/V Waynette Buttery)   Initial Endpoint 3   Dilution (10% Glycerin) Used 2   Volume of Dilution Used 0.74ml   Lambs Quarter 1:20 W/V Waynette Buttery)   Initial Endpoint 2   Dilution (10% Glycerin) Used 1   Volume of Dilution Used 0.51ml   Timothy Grass 100,000 BAU/ML (Greer)   Initial Endpoint   (no endpoint)   Dilution (10% Glycerin) Used 5   Volume of Dilution Used 0.14ml   Johnson Grass 1:20 W/V Waynette Buttery)   Initial Endpoint 4   Dilution (10% Glycerin) Used 3   Volume of Dilution Used 0.35ml   Box Elder 1:20 W/V Waynette Buttery)   Initial Endpoint 3   Dilution (10% Glycerin) Used 2    Volume of Dilution Used 0.46ml   Sycamore, American Eastern 1:20 W/V Waynette Buttery)   Initial Endpoint 2   Dilution (10% Glycerin) Used 1   OTHER   Antigen Volume 1.4   Diluent (Normal Saline) Volume 3.6   Total Volume 77ml   Antigen Volume 2   Diluent (Normal Saline) Volume 3   Total Volume 44ml   Vial #2   Preparation Date 07/05/19   Expiration Date 10/08/19   Immunotherapy Status Increase   Diagnosis Allergic Rhinitis due to cat (J30.81);Allergic Rhinits due to dog (J30.81);Allergic Rhinitis due to dustmite (J30.89);Allergic Rhinitis due to mold (J30.89);Allergy to cockroach (Z91.038)   Cat Hair 10,000 BAU/ML Waynette Buttery)   Initial Endpoint 5   Dilution (10% Glycerin) Used 4   Volume of Dilution Used 0.74ml   Dog Epithelia 1:20 W/V Waynette Buttery)   Initial Endpoint 2   Dilution (10% Glycerin) Used 1   Volume of Dilution Used 0.22ml   Dustmite Farinae  10,000 AU/ML (GREER)    Initial Endpoint 5    Dilution (10% Glycerin) Used 4   Volume of Dilution Used 0.69ml   Dustmite Pteronyssinus 10,000 AU/ML Waynette Buttery)   Initial Endpoint 6    Dilution (10% Glycerin) Used 5   Volume of Dilution Used 0.53ml   Aspergillus Fumigatus 1:20 W/V Waynette Buttery)    Initial Endpoint 5   Dilution (10% Glycerin) Used 4   Volume of Dilution Used 0.33ml   Alternaria 1:20 W/V Waynette Buttery)   Initial Endpoint 2    Dilution (10% Glycerin) Used 1   Volume of Dilution Used 0.61ml   Cladosporium Sphaerospermum 1:20 W/V Waynette Buttery)   Initial Endpoint 3   Dilution (10% Glycerin) Used 2   Volume of Dilution Used 0.46ml   Penicillium Notatum 1:20 W/V Waynette Buttery)   Initial Endpoint 6   Dilution (10% Glycerin) Used 5   Volume of Dilution Used 0.78ml   Bipoloris Sorokiniana 1:20 W/V Waynette Buttery)    Initial Endpoint 2   Dilution (10% Glycerin) Used 1   Volume of Dilution Used 0.34ml   Cockroach, American 1:20 W/V Waynette Buttery)   Initial Endpoint 3   Dilution (10% Glycerin) Used 2   Volume of Dilution Used 0.29ml   2 (10  unit) vials mixed.

## 2019-07-15 ENCOUNTER — Other Ambulatory Visit: Payer: Self-pay

## 2019-07-15 ENCOUNTER — Ambulatory Visit (INDEPENDENT_AMBULATORY_CARE_PROVIDER_SITE_OTHER): Payer: BC Managed Care – PPO

## 2019-07-15 DIAGNOSIS — J3081 Allergic rhinitis due to animal (cat) (dog) hair and dander: Secondary | ICD-10-CM

## 2019-07-15 DIAGNOSIS — J3089 Other allergic rhinitis: Secondary | ICD-10-CM

## 2019-07-15 DIAGNOSIS — Z91038 Other insect allergy status: Secondary | ICD-10-CM

## 2019-07-15 DIAGNOSIS — J301 Allergic rhinitis due to pollen: Secondary | ICD-10-CM

## 2019-07-15 NOTE — Nursing Note (Signed)
07/15/19 0935 07/15/19 0936   Mixed Vials   Comments NAV NAV   Allergy Injection Flowsheet   Date of Injection 07/15/19 07/15/19   Any Reaction to Previous Administration? No No   Any Illness/ Fever in Last 24 hours?* No No   Any Change in Medication? No No   Is the patient on a beta blocker? No No   Does the patient have a valid EpiPen at visit? Yes Yes   Does patient have a history of Asthma? No No   Expiration date 07/28/19 07/28/19   Dose 0.05ML 0.05ML   Maintenance Dose 0.50ML 0.50ML   Site Right arm Left arm   Patient tolerated procedure well yes yes   Allergy Diagnosis J30.89-allergic rhinitis due to dust mite;J30.89 allergic rhinitis due to mold;J30.81 alleric rhinitis due to cat;J30.81 allergic rhinitis due to dog;Z91.038-allergy to cockroach J30.1-allergic rhinitis due to pollen   Time given 0935 0936   Time read 0955 0955   Patient declined to wait 20 minutes No No   Managing Physician Jesse Fall   Initials SC SC   Were the vials mailed? No No   Marcene Duos, MD  07/15/2019, 17:40

## 2019-07-18 ENCOUNTER — Other Ambulatory Visit (HOSPITAL_COMMUNITY): Payer: Self-pay

## 2019-07-18 DIAGNOSIS — R1030 Lower abdominal pain, unspecified: Secondary | ICD-10-CM

## 2019-07-21 ENCOUNTER — Other Ambulatory Visit: Payer: Self-pay

## 2019-07-21 ENCOUNTER — Ambulatory Visit (INDEPENDENT_AMBULATORY_CARE_PROVIDER_SITE_OTHER): Payer: BC Managed Care – PPO

## 2019-07-21 DIAGNOSIS — J3081 Allergic rhinitis due to animal (cat) (dog) hair and dander: Secondary | ICD-10-CM

## 2019-07-21 DIAGNOSIS — Z91038 Other insect allergy status: Secondary | ICD-10-CM

## 2019-07-21 DIAGNOSIS — J3089 Other allergic rhinitis: Secondary | ICD-10-CM

## 2019-07-21 NOTE — Nursing Note (Signed)
07/21/19 8115 07/21/19 0940   Allergy Injection Flowsheet   Date of Injection 07/21/19 07/21/19   Any Reaction to Previous Administration? No No   Any Illness/ Fever in Last 24 hours?* No No   Any Change in Medication? No No   Is the patient on a beta blocker? No No   Does the patient have a valid EpiPen at visit? Yes Yes   Does patient have a history of Asthma? No No   Expiration date 07/28/19 07/28/19   Dose 0.34ml 0.44ml   Maintenance Dose 0.19ml 0.51ml   Site Right arm Left arm   Patient tolerated procedure well yes yes   Allergy Diagnosis J30.89-allergic rhinitis due to dust mite;J30.89 allergic rhinitis due to mold;J30.81 alleric rhinitis due to cat;J30.81 allergic rhinitis due to dog;Z91.038-allergy to cockroach J30.1-allergic rhinitis due to pollen   Time given 0940 0941   Time read 1000 1001   Patient declined to wait 20 minutes No No   Managing Physician Jesse Fall   Initials sc sc   Were the vials mailed? No No

## 2019-08-07 ENCOUNTER — Ambulatory Visit
Admission: RE | Admit: 2019-08-07 | Discharge: 2019-08-07 | Disposition: A | Discharge status: Home / Self Care | Payer: BC Managed Care – PPO | Source: Ambulatory Visit

## 2019-08-07 ENCOUNTER — Other Ambulatory Visit: Payer: Self-pay

## 2019-08-07 DIAGNOSIS — R1030 Lower abdominal pain, unspecified: Secondary | ICD-10-CM | POA: Insufficient documentation

## 2019-08-07 MED ORDER — IOHEXOL 12  MG IODINE/ML ORAL SOLUTION
500.00 mL | ORAL | Status: AC
Start: 2019-08-07 — End: 2019-08-07
  Administered 2019-08-07: 500 mL via ORAL

## 2019-08-12 ENCOUNTER — Other Ambulatory Visit (HOSPITAL_COMMUNITY): Payer: Self-pay

## 2019-08-25 ENCOUNTER — Ambulatory Visit (INDEPENDENT_AMBULATORY_CARE_PROVIDER_SITE_OTHER): Payer: Self-pay

## 2019-09-02 ENCOUNTER — Other Ambulatory Visit: Payer: Self-pay

## 2019-09-02 ENCOUNTER — Ambulatory Visit (INDEPENDENT_AMBULATORY_CARE_PROVIDER_SITE_OTHER): Payer: BC Managed Care – PPO

## 2019-09-02 DIAGNOSIS — J3081 Allergic rhinitis due to animal (cat) (dog) hair and dander: Secondary | ICD-10-CM

## 2019-09-02 DIAGNOSIS — Z91038 Other insect allergy status: Secondary | ICD-10-CM

## 2019-09-02 DIAGNOSIS — J3089 Other allergic rhinitis: Secondary | ICD-10-CM

## 2019-09-02 NOTE — Nursing Note (Signed)
09/02/19 0955 09/02/19 0956   Allergy Injection Flowsheet   Date of Injection 09/02/19 09/02/19   Any Reaction to Previous Administration? No No   Any Illness/ Fever in Last 24 hours?* No No   Any Change in Medication? No No   Is the patient on a beta blocker? No No   Does the patient have a valid EpiPen at visit? Yes Yes   Does patient have a history of Asthma? No No   Expiration date 10/08/19 10/08/19   Dose 0.48ml 0.19ml   Maintenance Dose 0.23ml 0.33ml   Site Right arm Left arm   Patient tolerated procedure well yes yes   Allergy Diagnosis J30.81 alleric rhinitis due to cat;J30.81 allergic rhinitis due to dog;J30.89 allergic rhinitis due to mold;J30.89-allergic rhinitis due to dust mite;Z91.038-allergy to cockroach J30.1-allergic rhinitis due to pollen   Time given 0955 0956   Time read 1015 1016   Patient declined to wait 20 minutes No No   Managing Physician Jesse Fall   Initials sc sc   Were the vials mailed? No No

## 2019-09-05 ENCOUNTER — Other Ambulatory Visit: Payer: Self-pay

## 2019-09-05 ENCOUNTER — Ambulatory Visit (INDEPENDENT_AMBULATORY_CARE_PROVIDER_SITE_OTHER): Payer: BC Managed Care – PPO | Admitting: Otolaryngology

## 2019-09-05 ENCOUNTER — Encounter (INDEPENDENT_AMBULATORY_CARE_PROVIDER_SITE_OTHER): Payer: Self-pay | Admitting: Otolaryngology

## 2019-09-05 VITALS — BP 132/88 | HR 76 | Temp 97.2°F | Ht 71.0 in | Wt 226.0 lb

## 2019-09-05 DIAGNOSIS — B49 Unspecified mycosis: Secondary | ICD-10-CM

## 2019-09-05 DIAGNOSIS — J309 Allergic rhinitis, unspecified: Secondary | ICD-10-CM

## 2019-09-05 DIAGNOSIS — J3089 Other allergic rhinitis: Secondary | ICD-10-CM

## 2019-09-05 DIAGNOSIS — J339 Nasal polyp, unspecified: Secondary | ICD-10-CM

## 2019-09-05 DIAGNOSIS — J329 Chronic sinusitis, unspecified: Secondary | ICD-10-CM

## 2019-09-05 DIAGNOSIS — Z9889 Other specified postprocedural states: Secondary | ICD-10-CM

## 2019-09-05 NOTE — Progress Notes (Signed)
[facility]  OTOLARYNGOLOGY DEPARTMENT    Follow up    NAME:  Alfons Sulkowski  MRN:  [id number]  DOB:  October 02, 1970  DOS:  09/05/2019    Subjective  James Frost is a 49 y.o. male who presents for follow up for for AFRS, s/p full FESS 02/2019. doing well. Today he reports that he is doing well with Flonase, Budesonide rinses.  He does not take Singulair regularly He also continues immunotherapy with no issues of local reactions. No significant complaints today    SNOT-22 score: 2      Objective    Physical Exam  Vitals:    09/05/19 0825   BP: 132/88   Pulse: 76   Temp: 36.2 C (97.2 F)   TempSrc: Thermal Scan   SpO2: 94%   Weight: 102 kg (225 lb 15.5 oz)   Height: 1.803 m (5\' 11" )   BMI: 31.58         General Appearance: Pleasant, cooperative, healthy, and in no acute distress.  Eyes: Conjunctivae/corneas clear, PERRLA, EOM's intact.  Head and Face: Normocephalic, atraumatic.  Face symmetric, no obvious lesions.   Pinnae: Normal shape and position.   Nose:  External pyramid midline.  See scope note.  Psychiatric:  Alert and oriented x 3.    Procedure: rigid nasal endoscopy  Pre-operative Dx: CRSwNP, AFRS s/p FESS  Post-operative Dx: same    Description: bilateral side(s) of the nose anesthetized with Afrin/lidocaine.  A 30-degree rigid endoscope was passed into the nose with the following findings:  Septum: midline  Right and left:  Inferior turbinate: normal   Middle turbinate: in medial position   Polyps: 0  Edema: 0  Drainage: 0  Scarring: 0  Crusting: 0  LK score: 0  NPS: 0    Nasopharynx: no obstruction or mass lesions    The patient tolerated the procedure well.      Frederich Montilla A. 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    PLAN:    Continue same medication including Flonase budesonide rinses and allergy immunotherapy.  I encouraged him to use Singulair.  Follow-up in 6 months or sooner  if needed    1. Chronic rhinosinusitis    2. Nasal polyposis    3. S/P FESS (functional endoscopic sinus surgery)    4. Allergic fungal sinusitis    5. Allergic rhinitis          Orders Placed This Encounter    03/2019 - NASAL ENDOSCOPY DIAGNOSTIC UNILATERAL OR BILATERAL (AMB ONLY)       Samyra Limb A. [id number], MD  Associate Professor  Otolaryngology-Head and Neck Surgery  [facility]    CC:    PCP [facility], MD  [address]  [place]   Referring Provider Self, Referral  No address on file

## 2019-09-05 NOTE — Procedures (Signed)
Procedure: rigid nasal endoscopy  Pre-operative Dx: CRSwNP, AFRS s/p FESS  Post-operative Dx: same    Description: bilateral side(s) of the nose anesthetized with Afrin/lidocaine.  A 30-degree rigid endoscope was passed into the nose with the following findings:  Septum: midline  Right and left:  Inferior turbinate: normal   Middle turbinate: in medial position   Polyps: 0  Edema: 0  Drainage: 0  Scarring: 0  Crusting: 0  LK score: 0  NPS: 0    Nasopharynx: no obstruction or mass lesions    The patient tolerated the procedure well.      James Frost A. Candelaria Celeste, MD  Associate Professor  Otolaryngology-Head and Neck Surgery  [facility]

## 2019-09-11 ENCOUNTER — Ambulatory Visit (INDEPENDENT_AMBULATORY_CARE_PROVIDER_SITE_OTHER): Payer: BC Managed Care – PPO

## 2019-09-11 ENCOUNTER — Other Ambulatory Visit: Payer: Self-pay

## 2019-09-11 DIAGNOSIS — J3089 Other allergic rhinitis: Secondary | ICD-10-CM

## 2019-09-11 DIAGNOSIS — J301 Allergic rhinitis due to pollen: Secondary | ICD-10-CM

## 2019-09-11 DIAGNOSIS — Z91038 Other insect allergy status: Secondary | ICD-10-CM

## 2019-09-11 DIAGNOSIS — J3081 Allergic rhinitis due to animal (cat) (dog) hair and dander: Secondary | ICD-10-CM

## 2019-09-11 NOTE — Nursing Note (Signed)
09/11/19 0835 09/11/19 0837   Allergy Injection Flowsheet   Date of Injection 09/11/19 09/11/19   Any Reaction to Previous Administration? No No   Any Illness/ Fever in Last 24 hours?* No No   Any Change in Medication? No No   Is the patient on a beta blocker? No No   Does the patient have a valid EpiPen at visit? Yes Yes   Does patient have a history of Asthma? No No   Expiration date 10/08/19 10/08/19   Dose 0.20 ml 0.20 ml   Maintenance Dose 0.50 ml 0.50 ml   Site Right arm Left arm   Patient tolerated procedure well yes yes   Allergy Diagnosis J30.89-allergic rhinitis due to dust mite;J30.81 alleric rhinitis due to cat;J30.81 allergic rhinitis due to dog;J30.89 allergic rhinitis due to mold;Z91.038-allergy to cockroach J30.1-allergic rhinitis due to pollen   Time given 0837 0837   Time read 0857 0857   Patient declined to wait 20 minutes No No   Managing Physician Jesse Fall   Initials jg jg   Were the vials mailed? No No

## 2019-09-17 ENCOUNTER — Other Ambulatory Visit: Payer: Self-pay

## 2019-09-17 ENCOUNTER — Ambulatory Visit (INDEPENDENT_AMBULATORY_CARE_PROVIDER_SITE_OTHER): Payer: BC Managed Care – PPO

## 2019-09-17 DIAGNOSIS — Z91038 Other insect allergy status: Secondary | ICD-10-CM

## 2019-09-17 DIAGNOSIS — J3089 Other allergic rhinitis: Secondary | ICD-10-CM

## 2019-09-17 DIAGNOSIS — J3081 Allergic rhinitis due to animal (cat) (dog) hair and dander: Secondary | ICD-10-CM

## 2019-09-17 DIAGNOSIS — J301 Allergic rhinitis due to pollen: Secondary | ICD-10-CM

## 2019-09-17 NOTE — Nursing Note (Signed)
09/17/19 0943 09/17/19 0946   Allergy Injection Flowsheet   Date of Injection 09/17/19 09/17/19   Any Reaction to Previous Administration? No No   Any Illness/ Fever in Last 24 hours?* No No   Any Change in Medication? No No   Is the patient on a beta blocker? No No   Does the patient have a valid EpiPen at visit? Yes Yes   Does patient have a history of Asthma? No No   Expiration date 10/08/19 10/08/19   Dose 0.25 ml 0.25 ml   Maintenance Dose 0.50 ml 0.50 ml   Site Right arm Left arm   Patient tolerated procedure well yes yes   Allergy Diagnosis J30.89-allergic rhinitis due to dust mite;J30.89 allergic rhinitis due to mold;J30.81 alleric rhinitis due to cat;J30.81 allergic rhinitis due to dog;Z91.038-allergy to cockroach J30.1-allergic rhinitis due to pollen   Time given 0946 0946   Time read 1006 1006   Patient declined to wait 20 minutes No No   Managing Physician Jesse Fall   Initials jg jg   Were the vials mailed? No No

## 2019-09-23 ENCOUNTER — Ambulatory Visit (INDEPENDENT_AMBULATORY_CARE_PROVIDER_SITE_OTHER): Payer: BC Managed Care – PPO

## 2019-09-23 ENCOUNTER — Other Ambulatory Visit: Payer: Self-pay

## 2019-09-23 DIAGNOSIS — J3089 Other allergic rhinitis: Secondary | ICD-10-CM

## 2019-09-23 DIAGNOSIS — J3081 Allergic rhinitis due to animal (cat) (dog) hair and dander: Secondary | ICD-10-CM

## 2019-09-23 DIAGNOSIS — Z91038 Other insect allergy status: Secondary | ICD-10-CM

## 2019-09-23 DIAGNOSIS — J301 Allergic rhinitis due to pollen: Secondary | ICD-10-CM

## 2019-09-23 NOTE — Nursing Note (Signed)
09/23/19 1010 09/23/19 1011   Allergy Injection Flowsheet   Date of Injection 09/23/19 09/23/19   Any Reaction to Previous Administration? No No   Any Illness/ Fever in Last 24 hours?* No No   Any Change in Medication? No No   Is the patient on a beta blocker? No No   Does the patient have a valid EpiPen at visit? Yes Yes   Does patient have a history of Asthma? No No   Expiration date 10/08/19 10/08/19   Dose 0.31ml 0.30ml   Maintenance Dose 0.72ml 0.102ml   Site Right arm Left arm   Patient tolerated procedure well yes yes   Allergy Diagnosis J30.81 alleric rhinitis due to cat;J30.81 allergic rhinitis due to dog;J30.89-allergic rhinitis due to dust mite;J30.89 allergic rhinitis due to mold;Z91.038-allergy to cockroach J30.1-allergic rhinitis due to pollen   Time given 1011 1011   Time read 1031 1031   Patient declined to wait 20 minutes No No   Managing Physician Jesse Fall   Initials sc sc   Were the vials mailed? No No

## 2019-10-02 ENCOUNTER — Ambulatory Visit (INDEPENDENT_AMBULATORY_CARE_PROVIDER_SITE_OTHER): Payer: Self-pay

## 2019-10-02 NOTE — Telephone Encounter (Signed)
Spoke to [employer]. We informed her that our policy is 24 hours between the allergy injection and covid vaccine. Per pt wife, she states that she was told by pharmacist that he needs to be off of the allergy injections for 1 week prior to covid vaccine and then 2 week after. He would then need his next vaccine which would make him not have an injection for almost 1.5 months. Message routed to provider to advise.

## 2019-10-02 NOTE — Telephone Encounter (Signed)
Arnold Long, MD  Waymond Cera, RN  Caller: Unspecified (Today, 9:25 AM)  We follow our protocol     Thanks                Informed Crystal of the above. She verbalized understanding. Denies further questions at this time.

## 2019-10-02 NOTE — Telephone Encounter (Signed)
-----   Message from Gearlean Alf, LPN sent at 7/78/2423  9:29 AM EDT -----  Regarding: FWAlesia Banda  ----- Message from Mikael Spray sent at 10/02/2019  9:27 AM EDT -----  Pt wife called in and was needing to know what the protocol is for allergy inj when getting the covid vaccine. He's scheduled today for an inj so she asked if she could be given a call as soon as possible. Thank you.

## 2019-10-06 ENCOUNTER — Other Ambulatory Visit (INDEPENDENT_AMBULATORY_CARE_PROVIDER_SITE_OTHER): Payer: Self-pay | Admitting: Otolaryngology

## 2019-10-06 DIAGNOSIS — J3089 Other allergic rhinitis: Secondary | ICD-10-CM

## 2019-10-06 DIAGNOSIS — Z91038 Other insect allergy status: Secondary | ICD-10-CM

## 2019-10-06 DIAGNOSIS — J3081 Allergic rhinitis due to animal (cat) (dog) hair and dander: Secondary | ICD-10-CM

## 2019-10-07 ENCOUNTER — Other Ambulatory Visit (INDEPENDENT_AMBULATORY_CARE_PROVIDER_SITE_OTHER): Payer: BC Managed Care – PPO | Admitting: Otolaryngology

## 2019-10-07 DIAGNOSIS — J3081 Allergic rhinitis due to animal (cat) (dog) hair and dander: Secondary | ICD-10-CM

## 2019-10-07 DIAGNOSIS — J3089 Other allergic rhinitis: Secondary | ICD-10-CM

## 2019-10-07 DIAGNOSIS — Z91038 Other insect allergy status: Secondary | ICD-10-CM

## 2019-10-08 ENCOUNTER — Encounter (INDEPENDENT_AMBULATORY_CARE_PROVIDER_SITE_OTHER): Payer: Self-pay | Admitting: Otolaryngology

## 2019-10-08 DIAGNOSIS — J3081 Allergic rhinitis due to animal (cat) (dog) hair and dander: Secondary | ICD-10-CM

## 2019-10-08 HISTORY — DX: Allergic rhinitis due to animal (cat) (dog) hair and dander: J30.81

## 2019-10-08 NOTE — Nursing Note (Signed)
10/07/19 1501   ENT Immunotherapy Vial Preparation Flowsheet   Allergy Test Date 03/07/19   Immunotherapy Start Date 03/20/19   Type of Immunotherapy? Subcutaneous   Managing Physician Makary   Initials cs   Vial #1   Preparation Date 10/07/19   Expiration Date 01/06/20   Immunotherapy Status No escalation   Diagnosis Allergic Rhinitis due to pollen (J30.1)   Ragweed, Short 1:20 W/V Waynette Buttery)   Initial Endpoint   (no endpoint)   Dilution (10% Glycerin) Used 5   Volume of Dilution Used 0.42ml   Plantain, English 1:20 W/V Waynette Buttery)   Initial Endpoint 3   Dilution (10% Glycerin) Used 2   Volume of Dilution Used 0.75ml   Lambs Quarter 1:20 W/V Waynette Buttery)   Initial Endpoint 2   Dilution (10% Glycerin) Used 1   Volume of Dilution Used 0.24ml   Timothy Grass 100,000 BAU/ML (Greer)   Initial Endpoint   (no endpoint)   Dilution (10% Glycerin) Used 5   Volume of Dilution Used 0.61ml   Johnson Grass 1:20 W/V Waynette Buttery)   Initial Endpoint 4   Dilution (10% Glycerin) Used 3   Volume of Dilution Used 0.77ml   Box Elder 1:20 W/V Waynette Buttery)   Initial Endpoint 3   Dilution (10% Glycerin) Used 2    Volume of Dilution Used 0.64ml   Sycamore, American Eastern 1:20 W/V Waynette Buttery)   Initial Endpoint 2   Dilution (10% Glycerin) Used 1   Volume of Dilution Used 0.56ml   OTHER   Antigen Volume 1.4   Diluent (Normal Saline) Volume 3.6   Total Volume 27ml   Antigen Volume 2   Diluent (Normal Saline) Volume 3   Total Volume 24ml   Vial #2   Preparation Date 10/07/19   Expiration Date 01/06/20   Immunotherapy Status No escalation   Diagnosis Allergic Rhinitis due to cat (J30.81);Allergic Rhinits due to dog (J30.81);Allergic Rhinitis due to dustmite (J30.89);Allergic Rhinitis due to mold (J30.89);Allergy to cockroach (Z91.038)   Cat Hair 10,000 BAU/ML Waynette Buttery)   Initial Endpoint 5   Dilution (10% Glycerin) Used 4   Volume of Dilution Used 0.6ml   Dog Epithelia 1:20 W/V Waynette Buttery)   Initial Endpoint 2   Dilution (10% Glycerin) Used 1   Volume of Dilution Used  0.25ml   Dustmite Farinae  10,000 AU/ML (GREER)   Initial Endpoint 5    Dilution (10% Glycerin) Used 4   Volume of Dilution Used 0.59ml   Dustmite Pteronyssinus 10,000 AU/ML Waynette Buttery)   Initial Endpoint 6    Dilution (10% Glycerin) Used 5   Volume of Dilution Used 0.42ml   Aspergillus Fumigatus 1:20 W/V Waynette Buttery)    Initial Endpoint 5   Dilution (10% Glycerin) Used 4   Volume of Dilution Used 0.56ml   Alternaria 1:20 W/V Waynette Buttery)   Initial Endpoint 2    Dilution (10% Glycerin) Used 1   Volume of Dilution Used 0.43ml   Cladosporium Sphaerospermum 1:20 W/V Waynette Buttery)   Initial Endpoint 3   Dilution (10% Glycerin) Used 2   Volume of Dilution Used 0.46ml   Penicillium Notatum 1:20 W/V Waynette Buttery)   Initial Endpoint 6   Dilution (10% Glycerin) Used 5   Volume of Dilution Used 0.61ml   Bipoloris Sorokiniana 1:20 W/V Waynette Buttery)    Initial Endpoint 2   Dilution (10% Glycerin) Used 1   Volume of Dilution Used 0.34ml   Cockroach, American 1:20 W/V Waynette Buttery)   Initial Endpoint 3   Dilution (10% Glycerin) Used 2  Volume of Dilution Used 0.92m   2 (10 unit) vials mixed.

## 2020-03-05 ENCOUNTER — Encounter (INDEPENDENT_AMBULATORY_CARE_PROVIDER_SITE_OTHER): Payer: Self-pay | Admitting: Otolaryngology

## 2020-03-18 ENCOUNTER — Other Ambulatory Visit: Payer: Self-pay

## 2020-03-18 ENCOUNTER — Ambulatory Visit (INDEPENDENT_AMBULATORY_CARE_PROVIDER_SITE_OTHER): Payer: BC Managed Care – PPO | Admitting: Otolaryngology

## 2020-03-18 VITALS — BP 142/80 | HR 79 | Temp 97.5°F | Ht 71.0 in | Wt 236.3 lb

## 2020-03-18 DIAGNOSIS — B49 Unspecified mycosis: Secondary | ICD-10-CM

## 2020-03-18 DIAGNOSIS — J309 Allergic rhinitis, unspecified: Secondary | ICD-10-CM

## 2020-03-18 DIAGNOSIS — J3089 Other allergic rhinitis: Secondary | ICD-10-CM

## 2020-03-18 DIAGNOSIS — Z9889 Other specified postprocedural states: Secondary | ICD-10-CM

## 2020-03-18 MED ORDER — FLUTICASONE PROPIONATE 50 MCG/ACTUATION NASAL SPRAY,SUSPENSION
2.0000 | Freq: Every day | NASAL | 5 refills | Status: DC
Start: 2020-03-18 — End: 2021-04-28

## 2020-03-18 NOTE — Procedures (Signed)
ENT, [facility] ENT  [address]  [place] [address]  [facility]  Procedure Note    Name: James Frost MRN:  [id number]   Date: 03/18/2020 Age: 50 y.o.       [address] - NASAL ENDOSCOPY DIAGNOSTIC UNILATERAL OR BILATERAL (AMB ONLY)    Date/Time: 03/18/2020 8:32 AM  Performed by: Arnold Long, MD  Authorized by: Arnold Long, MD     Time Out:     Immediatley before the procedure, a time out was called:  Yes    Patient verified:  Yes    Procedure Verified:  Yes    Site Verified:  Yes      Procedure: rigid nasal endoscopy  Pre-operative Dx: AFRS, CRSwNP s/p FESS  Post-operative Dx: same    Description: bilateral side(s) of the nose anesthetized with Afrin/lidocaine.  A 30-degree rigid endoscope was passed into the nose with the following findings:  Septum: midline   Right and left:  Inferior turbinate: normal   Middle turbinate: in medial position   Polyps: 0  Edema: 0  Drainage: 0  Scarring: 0  Crusting: 0  LK score: 0  NPS: 0      Nasopharynx: no obstruction or mass lesions    The patient tolerated the procedure well.      James Frost A. Candelaria Celeste, MD  Associate Professor  Otolaryngology-Head and Neck Surgery  [facility]        [place]old Long, [place]

## 2020-03-18 NOTE — Progress Notes (Signed)
[facility]  OTOLARYNGOLOGY DEPARTMENT    Follow up    NAME:  James Frost  MRN:  [id number]  DOB:  [birth year]  DOS:  03/18/2020    Subjective  James Frost is a 50 y.o. 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 doing well.  He is doing budesonide saline rinses at least once a day.  He stopped his allergy immunotherapy back in September.  He also is using Flonase only every now and then.  He is doing great as far as symptoms.    SNOT-22 score: 7    Objective    Physical Exam  Vitals:    03/18/20 0815   BP: (!) 142/80   Pulse: 79   Temp: 36.4 C (97.5 F)   SpO2: 94%   Weight: 107 kg (236 lb 5.3 oz)   Height: 1.803 m (5\' 11" )   BMI: 33.03         General Appearance: Pleasant, cooperative, healthy, and in no acute distress.  Eyes: Conjunctivae/corneas clear, PERRLA, EOM's intact.  Head and Face: Normocephalic, atraumatic.  Face symmetric, no obvious lesions.   Pinnae: Normal shape and position.   Nose:  External pyramid midline.  See scope note.    ENT, [facility] ENT  [address]  [place]  [facility]  Procedure Note    Name: James Frost MRN:  Darlin Coco   Date: 03/18/2020 Age: 50 y.o.       [address]31 - NASAL ENDOSCOPY DIAGNOSTIC UNILATERAL OR BILATERAL (AMB ONLY)    Date/Time: 03/18/2020 8:32 AM  Performed by: 05/18/2020, MD  Authorized by: Arnold Long, MD     Time Out:     Immediatley before the procedure, a time out was called:  Yes    Patient verified:  Yes    Procedure Verified:  Yes    Site Verified:  Yes      Procedure: rigid nasal endoscopy  Pre-operative Dx: AFRS, CRSwNP s/p FESS  Post-operative Dx: same    Description: bilateral side(s) of the nose anesthetized with Afrin/lidocaine.  A 30-degree rigid endoscope was passed into the nose with the following findings:  Septum: midline   Right and left:  Inferior turbinate: normal   Middle turbinate: in medial position   Polyps: 0  Edema: 0  Drainage: 0   Scarring: 0  Crusting: 0  LK score: 0  NPS: 0      Nasopharynx: no obstruction or mass lesions    The patient tolerated the procedure well.      Dana Debo A. 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 doing well.     PLAN:    Continue budesonide saline irrigations.  I encouraged him to use Flonase regularly.  We will hold off on allergy immunotherapy for now since he is doing great.  Follow-up in 6 months    1. Allergic fungal sinusitis    2. Allergic rhinitis    3. S/P FESS (functional endoscopic sinus surgery)          Orders Placed This Encounter    03/2019 - NASAL ENDOSCOPY DIAGNOSTIC UNILATERAL OR BILATERAL (AMB ONLY)    fluticasone propionate (FLONASE) 50 mcg/actuation Nasal Spray, Suspension       Redonna Wilbert A. [id number], MD  Associate Professor  Otolaryngology-Head  and Neck Surgery  [facility]    CC:    PCP Sharol Harness, MD  [address]  [place] [address]   Referring Provider Self, Referral  No address on file

## 2020-09-02 ENCOUNTER — Other Ambulatory Visit (INDEPENDENT_AMBULATORY_CARE_PROVIDER_SITE_OTHER): Payer: Self-pay | Admitting: Otolaryngology

## 2020-09-02 NOTE — Telephone Encounter (Signed)
Last visit 03/18/2020. Refill routed to provider for approval .

## 2020-09-30 ENCOUNTER — Encounter (INDEPENDENT_AMBULATORY_CARE_PROVIDER_SITE_OTHER): Payer: Self-pay | Admitting: Otolaryngology

## 2020-11-26 ENCOUNTER — Other Ambulatory Visit: Payer: Self-pay

## 2020-11-26 ENCOUNTER — Ambulatory Visit (INDEPENDENT_AMBULATORY_CARE_PROVIDER_SITE_OTHER): Payer: BC Managed Care – PPO | Admitting: Otolaryngology

## 2020-11-26 VITALS — BP 132/88 | HR 84 | Temp 97.6°F | Ht 71.0 in | Wt 226.6 lb

## 2020-11-26 DIAGNOSIS — J329 Chronic sinusitis, unspecified: Secondary | ICD-10-CM

## 2020-11-26 DIAGNOSIS — J339 Nasal polyp, unspecified: Secondary | ICD-10-CM

## 2020-11-26 DIAGNOSIS — Z9889 Other specified postprocedural states: Secondary | ICD-10-CM

## 2020-11-26 DIAGNOSIS — J310 Chronic rhinitis: Secondary | ICD-10-CM

## 2020-11-26 DIAGNOSIS — J309 Allergic rhinitis, unspecified: Secondary | ICD-10-CM

## 2020-11-26 DIAGNOSIS — J3089 Other allergic rhinitis: Secondary | ICD-10-CM

## 2020-11-26 DIAGNOSIS — B49 Unspecified mycosis: Secondary | ICD-10-CM

## 2020-11-26 MED ORDER — MONTELUKAST 10 MG TABLET
10.0000 mg | ORAL_TABLET | Freq: Every day | ORAL | 3 refills | Status: DC
Start: 2020-11-26 — End: 2021-04-28

## 2020-11-26 MED ORDER — METHYLPREDNISOLONE 4 MG TABLETS IN A DOSE PACK
ORAL_TABLET | ORAL | 0 refills | Status: DC
Start: 2020-11-26 — End: 2021-04-28

## 2020-11-26 MED ORDER — AMOXICILLIN 875 MG-POTASSIUM CLAVULANATE 125 MG TABLET
1.0000 | ORAL_TABLET | Freq: Two times a day (BID) | ORAL | 0 refills | Status: AC
Start: 2020-11-26 — End: 2020-12-10

## 2020-11-26 NOTE — Procedures (Signed)
ENT, [facility]  [address]  [place] [address]  [facility]  Procedure Note    Name: Torrance Stockley MRN:  [id number]   Date: 11/26/2020 Age: 50 y.o.       31231 - NASAL ENDOSCOPY DIAGNOSTIC UNILATERAL OR BILATERAL (AMB ONLY)    Date/Time: 11/26/2020 2:45 PM  Performed by: Arnold Long, MD  Authorized by: Arnold Long, MD     Time Out:     Immediately before the procedure, a time out was called:  Yes    Patient verified:  Yes    Procedure Verified:  Yes    Site Verified:  Yes      Procedure: rigid nasal endoscopy  Pre-operative Dx: CRSwNP s/p FESS  Post-operative Dx: same    Description: bilateral side(s) of the nose anesthetized with Afrin/lidocaine.  A 30-degree rigid endoscope was passed into the nose with the following findings:  Septum: midline   Right  Inferior turbinate: normal   Middle turbinate: in medial position   Polyps: 0  Edema: 0  Drainage: 0  Scarring: 0  Crusting: 0  LK score: 0  NPS: 0    Left:  Inferior turbinate: normal   Middle turbinate: in medial position   Polyps: 0  Edema: 1  Drainage: 2 some mild purulence in the maxillary sinus  Scarring: 0  Crusting: 0  LK score: 3  NPS: 0    Nasopharynx: no obstruction or mass lesions    The patient tolerated the procedure well.      Syrah Daughtrey A. Candelaria Celeste, MD  Associate Professor  Otolaryngology-Head and Neck Surgery  [facility]        [place], [place]

## 2020-11-26 NOTE — Progress Notes (Signed)
[facility]  OTOLARYNGOLOGY DEPARTMENT    Follow up    NAME:  James Frost  MRN:  [id number]  DOB:  03/14/1970  DOS:  11/26/2020    Subjective  James Frost is a 50 y.o. male who presents for follow up for for AFRS,s/p full FESS 02/2019. doing well.  He stopped his rinses over the summer and now he restarted them about 4 weeks ago including budesonide rinses.  He is not on any other medication.  He also stopped allergy immunotherapy.    he feels little bit stuffy specially on the left side.    SNOT-22 score: 9    Objective    Physical Exam  Vitals:    11/26/20 1425   BP: 132/88   Pulse: 84   Temp: 36.4 C (97.6 F)   TempSrc: Thermal Scan   SpO2: 94%   Weight: 103 kg (226 lb 10.1 oz)   Height: 1.803 m (5\' 11" )   BMI: 31.67         General Appearance: Pleasant, cooperative, healthy, and in no acute distress.  Eyes: Conjunctivae/corneas clear, EOM's intact.  Head and Face: Normocephalic, atraumatic.  Face symmetric, no obvious lesions.   Pinnae: Normal shape and position.   Nose:  External pyramid midline. See scope note.   Psychiatric:  Alert and oriented x 3.    ENT, [facility]  [address]  [place]  [facility]  Procedure Note    Name: James Frost MRN:  Darlin Coco   Date: 11/26/2020 Age: 50 y.o.       31231 - NASAL ENDOSCOPY DIAGNOSTIC UNILATERAL OR BILATERAL (AMB ONLY)    Date/Time: 11/26/2020 2:45 PM  Performed by: 11/28/2020, MD  Authorized by: Arnold Long, MD     Time Out:     Immediately before the procedure, a time out was called:  Yes    Patient verified:  Yes    Procedure Verified:  Yes    Site Verified:  Yes      Procedure: rigid nasal endoscopy  Pre-operative Dx: CRSwNP s/p FESS  Post-operative Dx: same    Description: bilateral side(s) of the nose anesthetized with Afrin/lidocaine.  A 30-degree rigid endoscope was passed into the nose with the following findings:  Septum: midline   Right  Inferior turbinate:  normal   Middle turbinate: in medial position   Polyps: 0  Edema: 0  Drainage: 0  Scarring: 0  Crusting: 0  LK score: 0  NPS: 0    Left:  Inferior turbinate: normal   Middle turbinate: in medial position   Polyps: 0  Edema: 1  Drainage: 2 some mild purulence in the maxillary sinus  Scarring: 0  Crusting: 0  LK score: 3  NPS: 0    Nasopharynx: no obstruction or mass lesions    The patient tolerated the procedure well.      Jeane Cashatt A. Arnold Long, MD  Associate Professor  Otolaryngology-Head and Neck Surgery  [facility]        [facility], MD        ASSESSMENT      1. Chronic rhinosinusitis    2. Nasal polyposis    3. Allergic rhinitis    4. Allergic fungal sinusitis    5. S/P FESS (functional endoscopic sinus surgery)    I encouraged him to continue budesonide rinses  Restart Singulair  Augmentin for 14 days and Medrol Dosepak for his current sinusitis  and follow up with me in 6 months or sooner if needed      Orders Placed This Encounter    31231 - NASAL ENDOSCOPY DIAGNOSTIC UNILATERAL OR BILATERAL (AMB ONLY)    amoxicillin-pot clavulanate (AUGMENTIN) 875-125 mg Oral Tablet    Methylprednisolone (MEDROL DOSEPACK) 4 mg Oral Tablets, Dose Pack    montelukast (SINGULAIR) 10 mg Oral Tablet       Amahri Dengel A. Candelaria Celeste, MD  Associate Professor  Otolaryngology-Head and Neck Surgery  [facility]    CC:    PCP Sharol Harness, MD  [address]  [place] [address]   Referring Provider Self, Referral  No address on file

## 2021-04-28 ENCOUNTER — Emergency Department
Admission: EM | Admit: 2021-04-28 | Discharge: 2021-04-28 | Disposition: A | Discharge status: Home / Self Care | Payer: Auto Insurance (includes no fault) | Attending: Nurse Practitioner | Admitting: Nurse Practitioner

## 2021-04-28 ENCOUNTER — Encounter (HOSPITAL_COMMUNITY): Payer: Self-pay

## 2021-04-28 ENCOUNTER — Emergency Department (HOSPITAL_COMMUNITY): Payer: Auto Insurance (includes no fault)

## 2021-04-28 ENCOUNTER — Emergency Department (EMERGENCY_DEPARTMENT_HOSPITAL): Payer: Auto Insurance (includes no fault)

## 2021-04-28 ENCOUNTER — Other Ambulatory Visit: Payer: Self-pay

## 2021-04-28 DIAGNOSIS — Y92410 Unspecified street and highway as the place of occurrence of the external cause: Secondary | ICD-10-CM

## 2021-04-28 DIAGNOSIS — M542 Cervicalgia: Secondary | ICD-10-CM

## 2021-04-28 DIAGNOSIS — F17210 Nicotine dependence, cigarettes, uncomplicated: Secondary | ICD-10-CM | POA: Insufficient documentation

## 2021-04-28 DIAGNOSIS — S0990XA Unspecified injury of head, initial encounter: Secondary | ICD-10-CM

## 2021-04-28 DIAGNOSIS — M546 Pain in thoracic spine: Secondary | ICD-10-CM

## 2021-04-28 DIAGNOSIS — S161XXA Strain of muscle, fascia and tendon at neck level, initial encounter: Secondary | ICD-10-CM | POA: Insufficient documentation

## 2021-04-28 DIAGNOSIS — S39012A Strain of muscle, fascia and tendon of lower back, initial encounter: Secondary | ICD-10-CM | POA: Insufficient documentation

## 2021-04-28 HISTORY — DX: Type 2 diabetes mellitus without complications (CMS HCC): E11.9

## 2021-04-28 MED ORDER — KETOROLAC 60 MG/2 ML INTRAMUSCULAR SOLUTION
30.0000 mg | INTRAMUSCULAR | Status: AC
Start: 2021-04-28 — End: 2021-04-28
  Administered 2021-04-28: 30 mg via INTRAMUSCULAR
  Filled 2021-04-28: qty 2

## 2021-04-28 MED ORDER — ACETAMINOPHEN 325 MG TABLET
975.0000 mg | ORAL_TABLET | ORAL | Status: AC
Start: 2021-04-28 — End: 2021-04-28
  Administered 2021-04-28: 975 mg via ORAL
  Filled 2021-04-28: qty 3
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
## 2021-05-02 ENCOUNTER — Other Ambulatory Visit (HOSPITAL_COMMUNITY): Payer: Self-pay | Admitting: Adult Reconstructive Orthopaedic Surgery

## 2021-05-02 ENCOUNTER — Other Ambulatory Visit: Payer: Self-pay

## 2021-05-02 ENCOUNTER — Inpatient Hospital Stay
Admission: RE | Admit: 2021-05-02 | Discharge: 2021-05-02 | Disposition: A | Discharge status: Home / Self Care | Payer: Auto Insurance (includes no fault) | Source: Ambulatory Visit | Attending: Adult Reconstructive Orthopaedic Surgery | Admitting: Adult Reconstructive Orthopaedic Surgery

## 2021-05-02 DIAGNOSIS — R52 Pain, unspecified: Secondary | ICD-10-CM | POA: Insufficient documentation

## 2021-05-10 IMAGING — MR MRI CERVICAL SPINE WITHOUT CONTRAST
6 series · 40 of 48 positions shown · non-contrast
Comparison: none

﻿Pertinent Hx:    Neck pain.
TECHNIQUE: Sagittal images with T1, T2, and STIR weighting are performed through the cervical spine. Axial images with T1 and T2 weighting are performed through the C2-3, C3-4, C4-5, C5-6, C6-7, and C7-T1 disc spaces.

[Series 2: T1 · sagittal · 3.0mm · 0.78mm/px · 6 of 15 slices shown (1 of 2)]
[im 1/15]
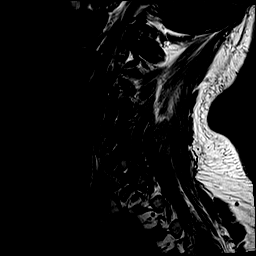
[im 3/15]
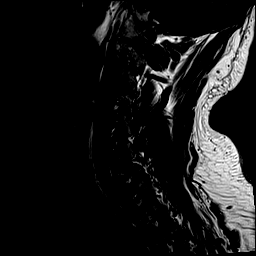
[im 6/15]
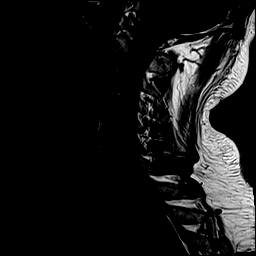
[im 9/15]
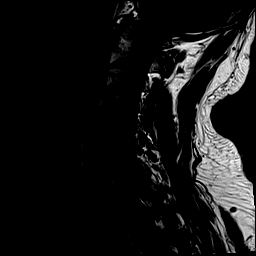
[im 12/15]
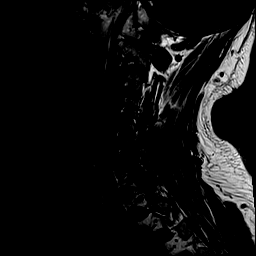
[im 15/15]
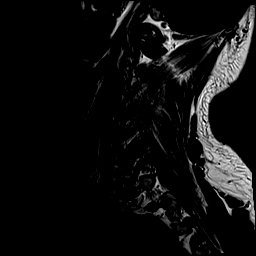

[Series 3: T2 · sagittal · 3.0mm · 0.39mm/px · 6 of 15 slices shown (1 of 3)]
[im 1/15]
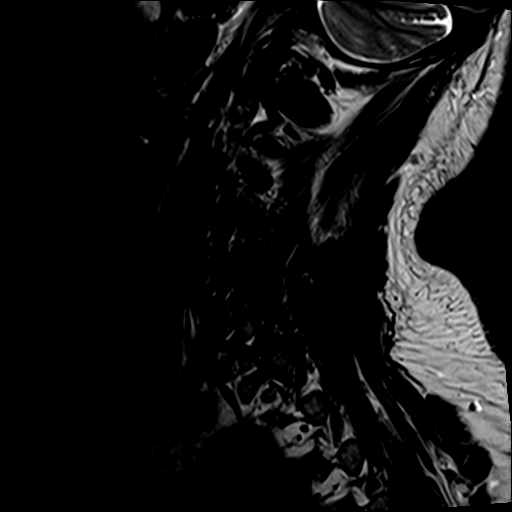
[im 3/15]
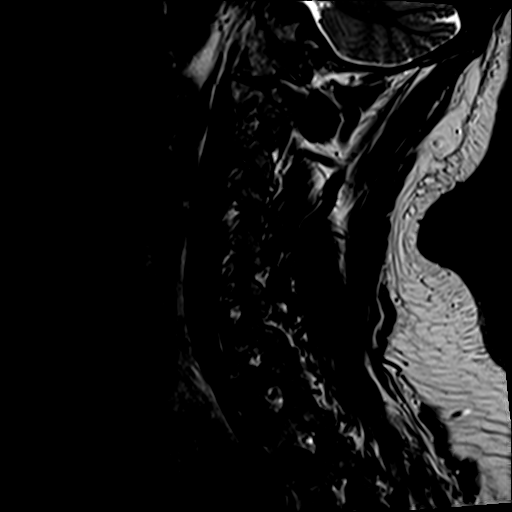
[im 6/15]
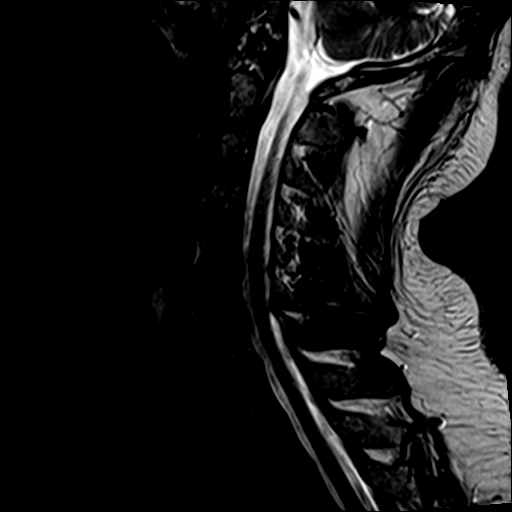
[im 9/15]
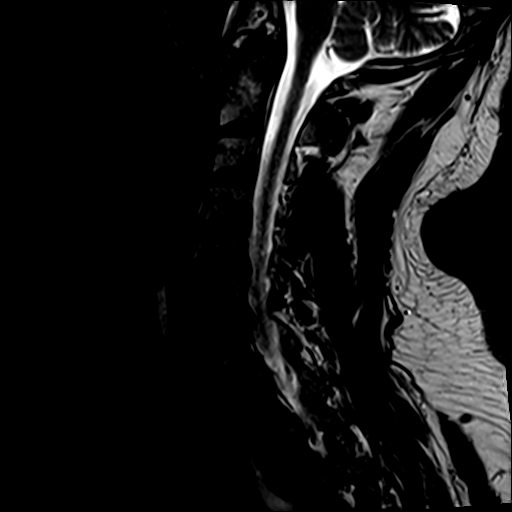
[im 12/15]
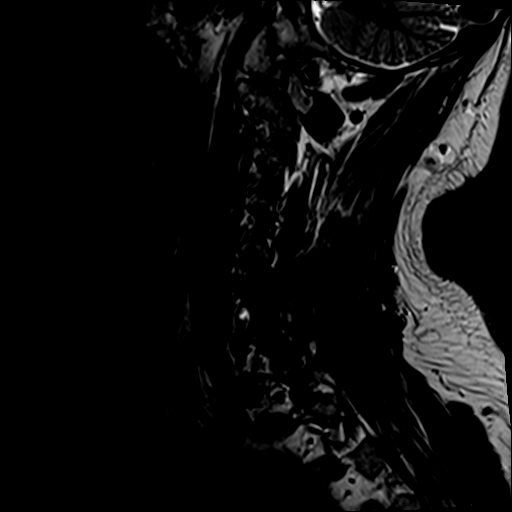
[im 15/15]
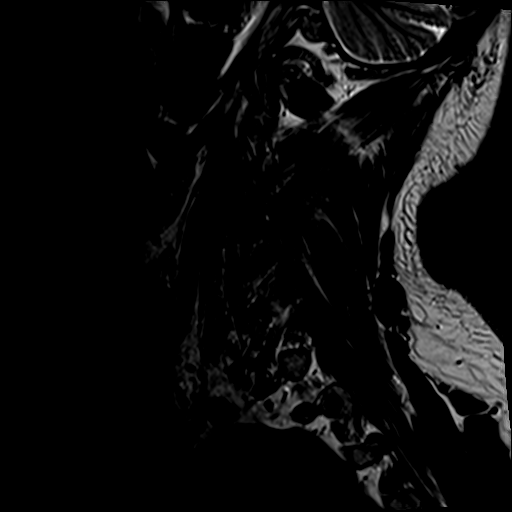

[Series 4: T2 · axial · 4.0mm · 0.62mm/px · z∈[-77,+63]mm · 8 of 26 slices shown (2 of 3)]
[im 1/26]
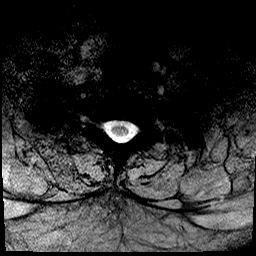
[im 3/26]
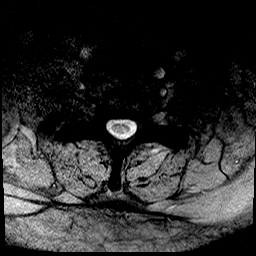
[im 9/26]
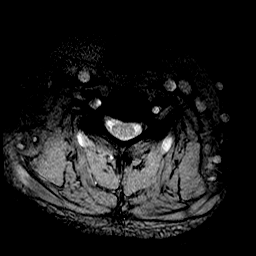
[im 12/26]
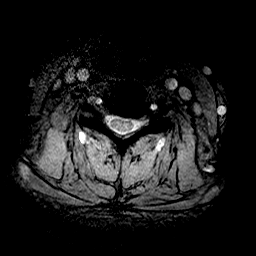
[im 14/26]
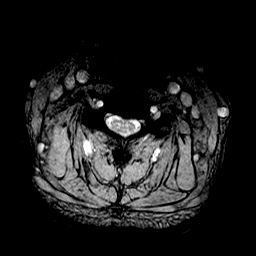
[im 17/26]
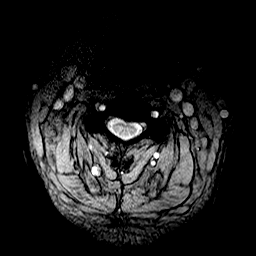
[im 23/26]
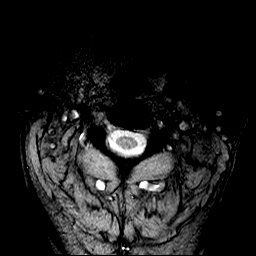
[im 26/26]
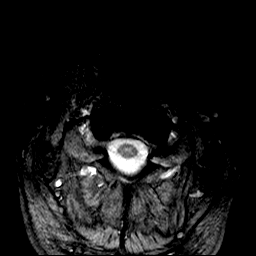

[Series 5: T1 · axial · 4.0mm · 0.62mm/px · z∈[-77,+63]mm · 8 of 26 slices shown (2 of 2)]
[im 1/26]
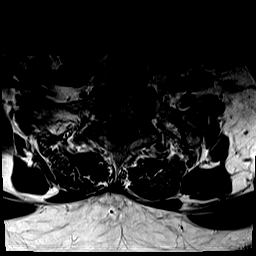
[im 3/26]
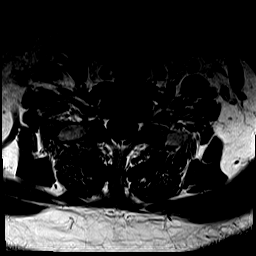
[im 9/26]
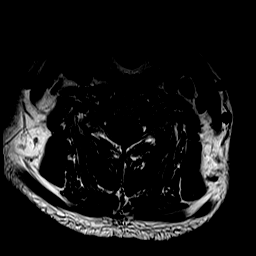
[im 12/26]
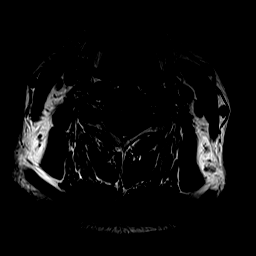
[im 14/26]
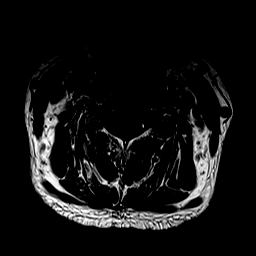
[im 17/26]
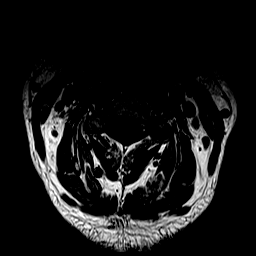
[im 23/26]
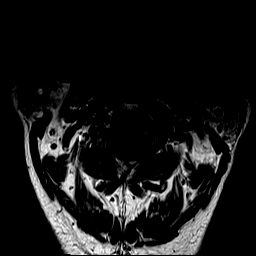
[im 26/26]
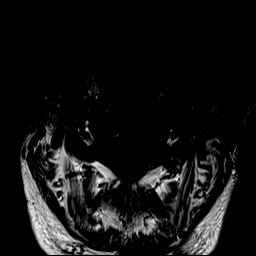

[Series 6: T2 · axial · 4.0mm · 0.62mm/px · z∈[-77,+63]mm · 8 of 26 slices shown (3 of 3)]
[im 1/26]
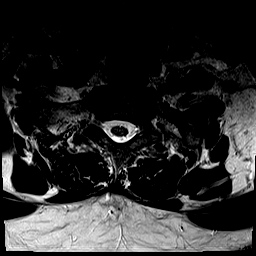
[im 3/26]
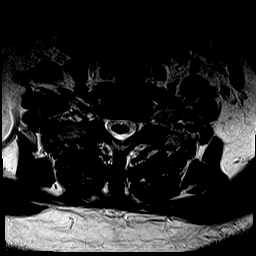
[im 9/26]
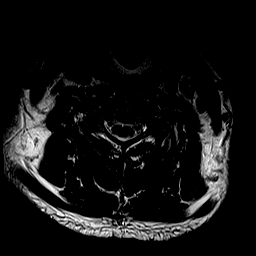
[im 12/26]
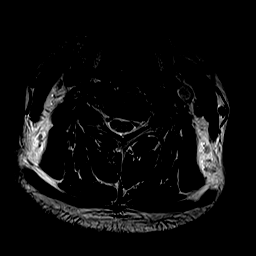
[im 14/26]
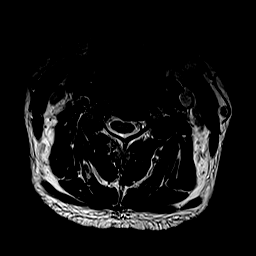
[im 17/26]
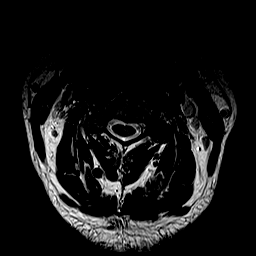
[im 23/26]
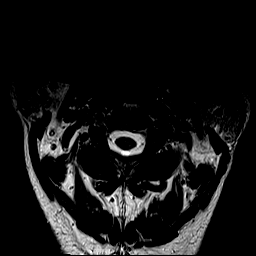
[im 26/26]
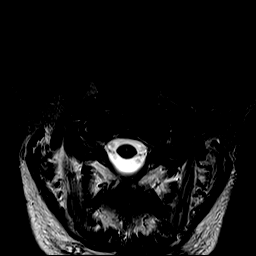

[Series 7: STIR · sagittal · 3.0mm · 0.39mm/px · 4 of 15 slices shown]
[im 1/15]
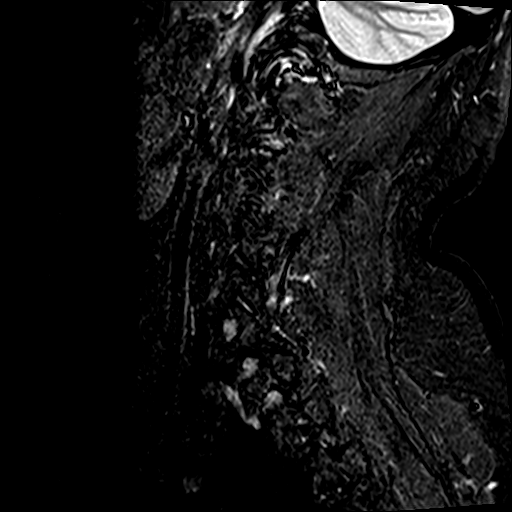
[im 3/15]
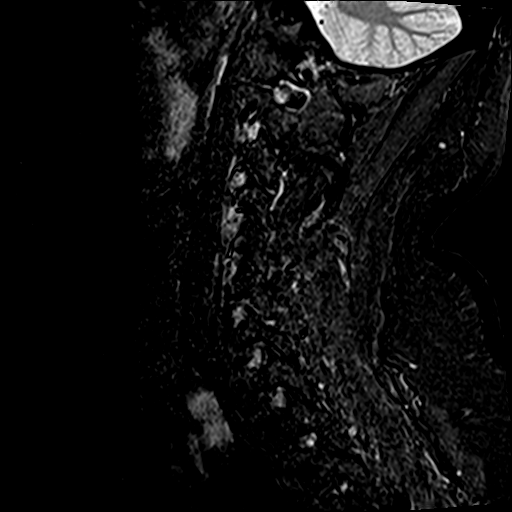
[im 6/15]
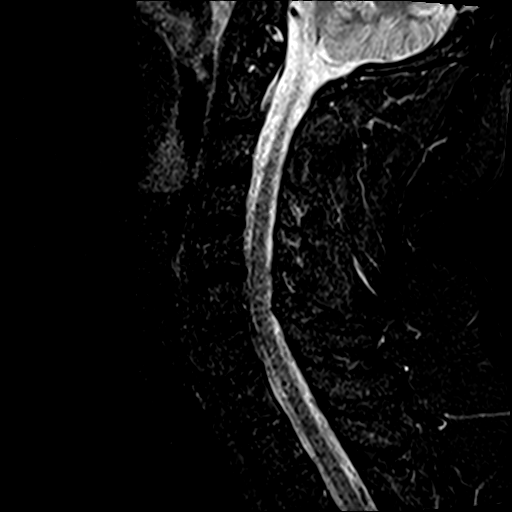
[im 9/15]
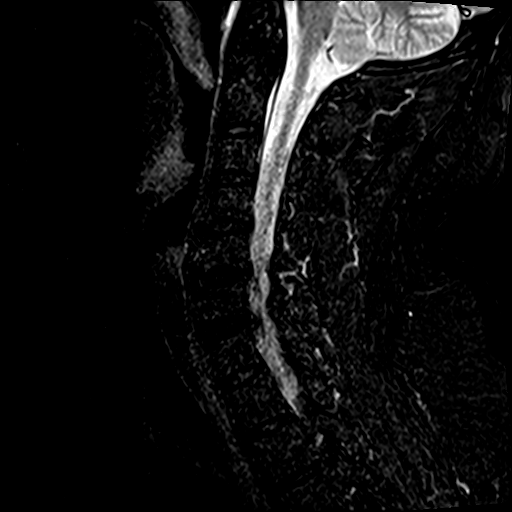

[40 of 48 positions shown; findings below may reference images not displayed]

FINDINGS: C2 through C5 is normal.  

Slight intradiscal degenerative change is present within C5-C6 associated with a broad-based paramedian bulge that partially effaces the subarachnoid space.  It produces very minimal biforaminal stenosis and very minimal left neural canal stenosis at C5-C6.  Image 16 series 4.  

A similar broad-based bulge at C6-C7 effaces the subarachnoid space but does not contact the cord and does not produce severe canal or foraminal stenosis. 

C7-T1 is normal.
IMPRESSION: Minimally abnormal due to minor degenerative changes at C5-C6 and C6-C7 that are described in the text of the report.  There is no abnormal signal in the cord.

## 2021-05-10 IMAGING — MR MRI LUMBAR SPINE WITHOUT CONTRAST
5 series · 48 of 48 positions shown · non-contrast
Comparison: none

﻿

Pertinent Hx:    Low back pain.
TECHNIQUE: Sagittal images with T1, T2, and STIR weighting are performed through the lumbar spine. Axial images with T1 weighting are performed consecutively from L2 to S1. Additional axials with T2 weighting are performed from L1-L2 through L5-S1.

[Series 2: T2 · sagittal · 3.5mm · 0.81mm/px · 8 of 17 slices shown (1 of 2)]
[im 1/17]
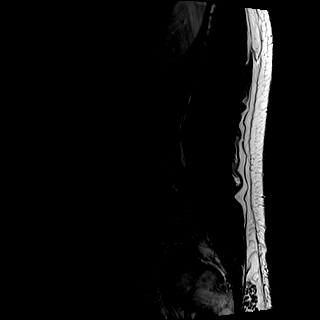
[im 3/17]
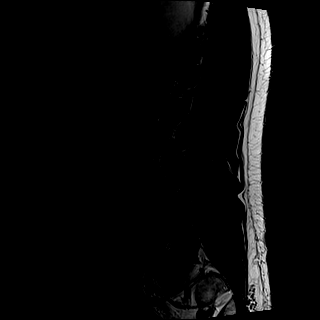
[im 5/17]
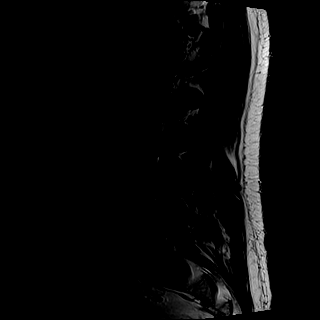
[im 7/17]
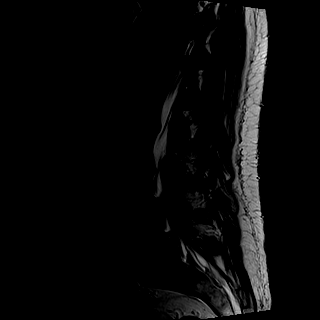
[im 10/17]
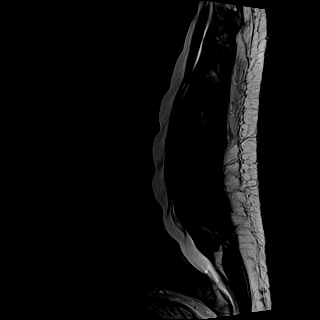
[im 12/17]
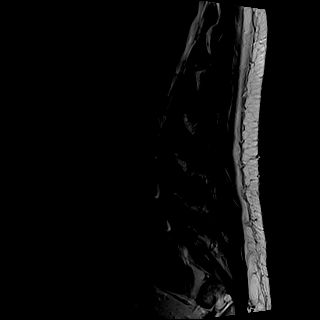
[im 14/17]
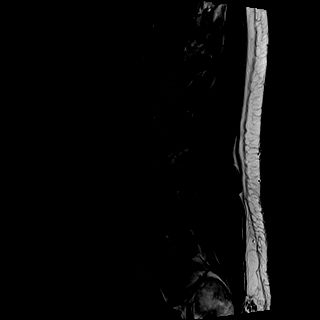
[im 17/17]
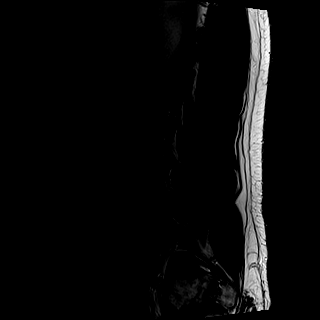

[Series 3: T1 · sagittal · 3.5mm · 0.81mm/px · 7 of 17 slices shown (1 of 2)]
[im 1/17]
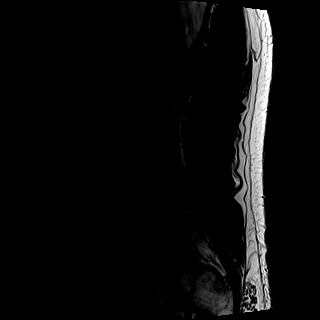
[im 3/17]
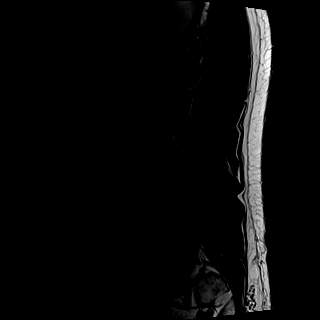
[im 6/17]
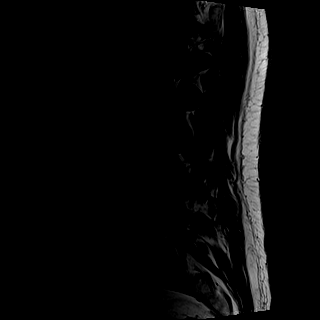
[im 9/17]
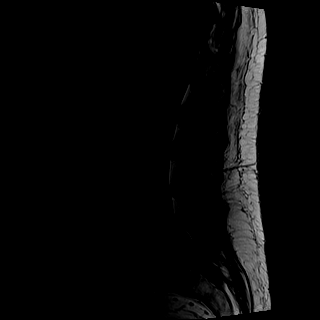
[im 11/17]
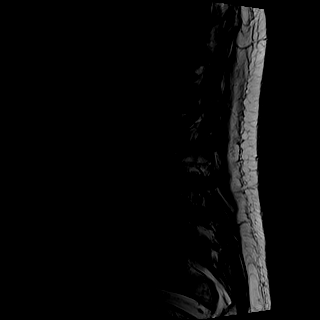
[im 14/17]
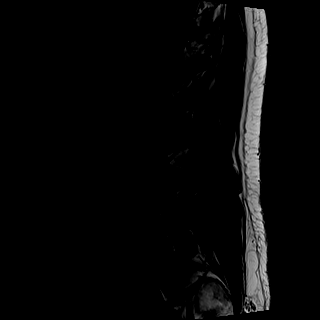
[im 17/17]
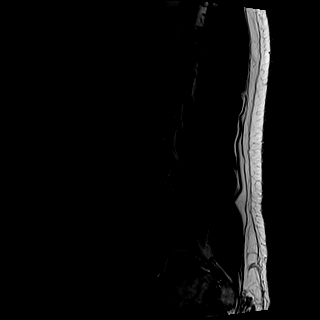

[Series 4: T2 · axial · 4.0mm · 0.70mm/px · z∈[-77,+146]mm · 13 of 29 slices shown (2 of 2)]
[im 1/29]
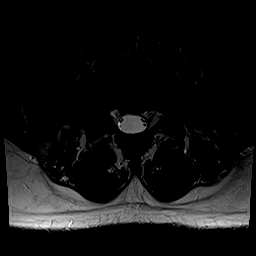
[im 3/29]
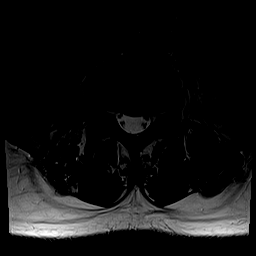
[im 5/29]
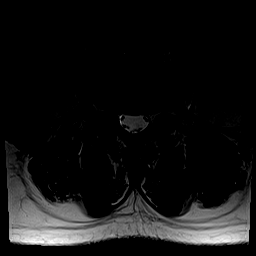
[im 8/29]
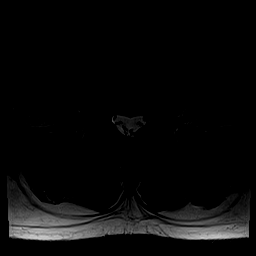
[im 10/29]
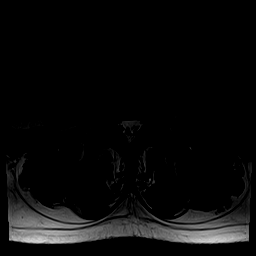
[im 12/29]
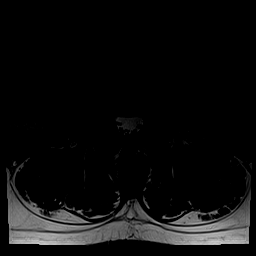
[im 15/29]
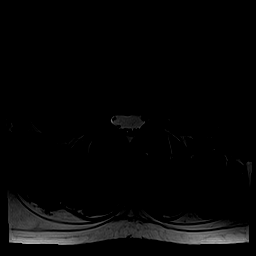
[im 17/29]
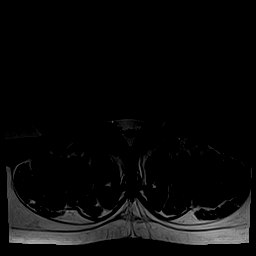
[im 19/29]
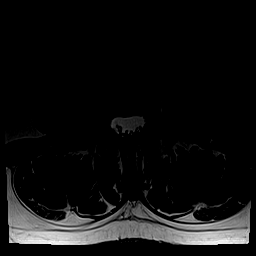
[im 22/29]
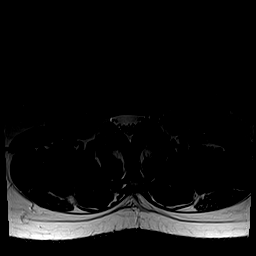
[im 24/29]
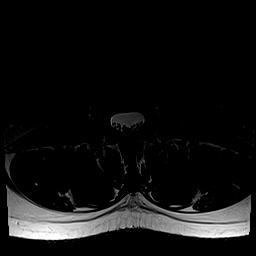
[im 26/29]
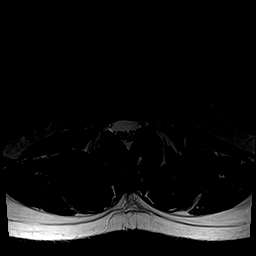
[im 29/29]
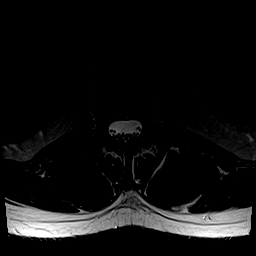

[Series 5: T1 · axial · 4.0mm · 0.70mm/px · z∈[-77,+146]mm · 13 of 29 slices shown (2 of 2)]
[im 1/29]
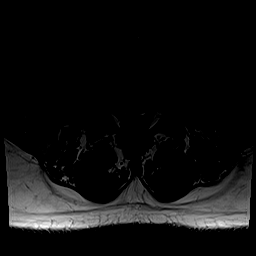
[im 3/29]
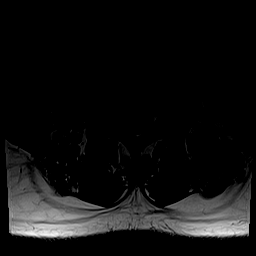
[im 5/29]
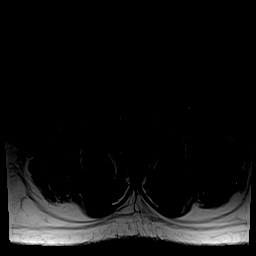
[im 8/29]
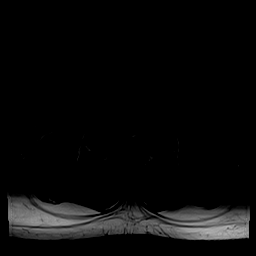
[im 10/29]
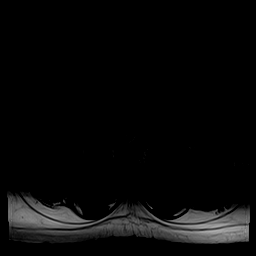
[im 12/29]
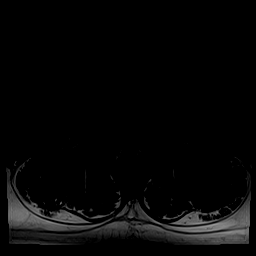
[im 15/29]
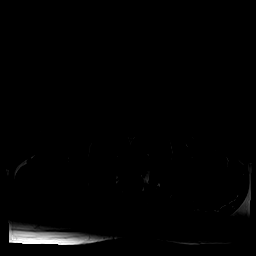
[im 17/29]
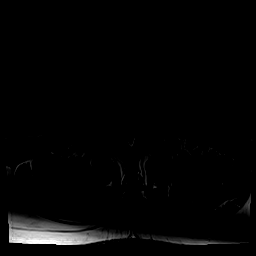
[im 19/29]
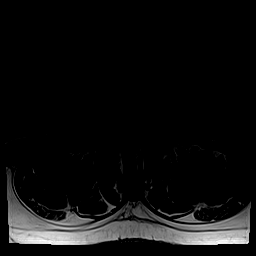
[im 22/29]
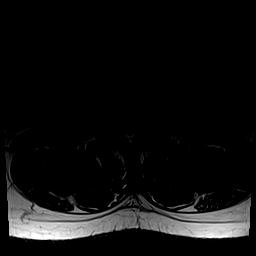
[im 24/29]
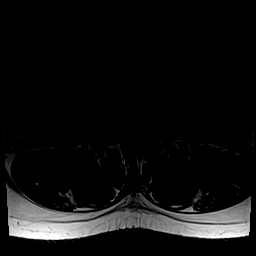
[im 26/29]
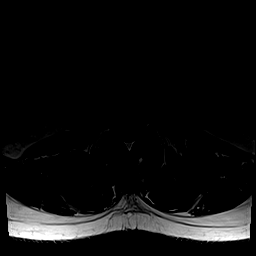
[im 29/29]
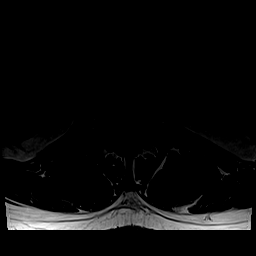

[Series 6: STIR · sagittal · 3.5mm · 1.02mm/px · 7 of 17 slices shown]
[im 1/17]
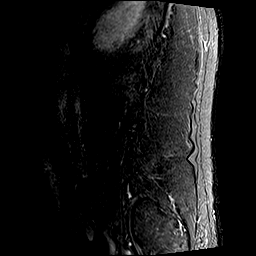
[im 3/17]
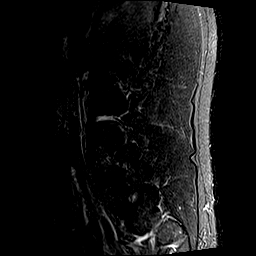
[im 6/17]
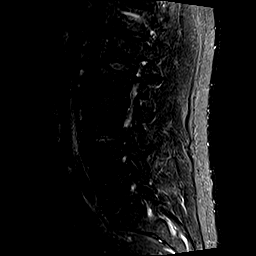
[im 9/17]
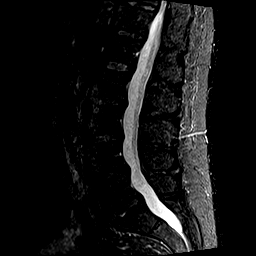
[im 11/17]
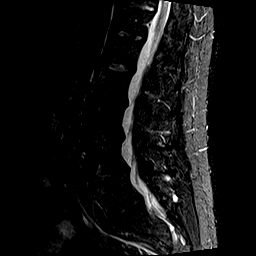
[im 14/17]
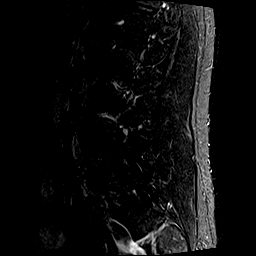
[im 17/17]
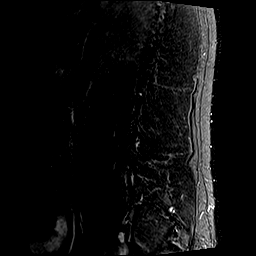

[48 of 48 positions shown; findings below may reference images not displayed]

FINDINGS: Minor multilevel intradiscal degenerative change is present throughout the lumbar spine.  There is neither canal nor foraminal narrowing of significance from L1 to L4.  

At L4-L5, a broad-based paramedian herniation effaces the subarachnoid space and extends into both foramina.  There is mild canal and moderate proximal biforaminal stenosis at L4-L5.  Image 19 series 4, image 9 series 2.  

A midline bulge at L5-S1 produces very minimal biforaminal stenosis.  Image 25 series 4.
IMPRESSION: Minimally abnormal due to changes mainly at L4-L5 and to a lesser extent L5-S1 that are described in the text of the report.

## 2021-05-27 ENCOUNTER — Ambulatory Visit (INDEPENDENT_AMBULATORY_CARE_PROVIDER_SITE_OTHER): Payer: BC Managed Care – PPO | Admitting: Otolaryngology

## 2021-05-27 ENCOUNTER — Other Ambulatory Visit: Payer: Self-pay

## 2021-05-27 ENCOUNTER — Encounter (INDEPENDENT_AMBULATORY_CARE_PROVIDER_SITE_OTHER): Payer: Self-pay | Admitting: Otolaryngology

## 2021-05-27 VITALS — BP 144/90 | HR 78 | Temp 98.8°F | Ht 71.0 in | Wt 230.2 lb

## 2021-05-27 DIAGNOSIS — J3089 Other allergic rhinitis: Secondary | ICD-10-CM

## 2021-05-27 DIAGNOSIS — B49 Unspecified mycosis: Secondary | ICD-10-CM

## 2021-05-27 DIAGNOSIS — J339 Nasal polyp, unspecified: Secondary | ICD-10-CM

## 2021-05-27 DIAGNOSIS — J329 Chronic sinusitis, unspecified: Secondary | ICD-10-CM

## 2021-05-27 DIAGNOSIS — J310 Chronic rhinitis: Secondary | ICD-10-CM

## 2021-05-27 DIAGNOSIS — J309 Allergic rhinitis, unspecified: Secondary | ICD-10-CM

## 2021-05-27 NOTE — Progress Notes (Signed)
[facility]  OTOLARYNGOLOGY DEPARTMENT    Follow up    NAME:  James Frost  MRN:  [id number]  DOB:  Feb 27, 1970  DOS:  05/27/2021    Subjective  James Frost is a 51 y.o. 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 doing well.  He is not using any rinses or allergy pills or Singulair.  He is not on allergy immunotherapy.        SNOT-22 score: 2    Objective    Physical Exam  Vitals:    05/27/21 1238   BP: (!) 144/90   Pulse: 78   Temp: 37.1 C (98.8 F)   TempSrc: Thermal Scan   SpO2: 95%   Weight: 104 kg (230 lb 2.6 oz)   Height: 1.803 m (5\' 11" )   BMI: 32.17         General Appearance: Pleasant, cooperative, healthy, and in no acute distress.  Eyes: Conjunctivae/corneas clear, EOM's intact.  Head and Face: Normocephalic, atraumatic.  Face symmetric, no obvious lesions.   Pinnae: Normal shape and position.   Nose:  External pyramid midline. See scope note.   Psychiatric:  Alert and oriented x 3.    ENT, [facility] ENT  [address]  [place]  Operated by [facility]  Procedure Note    Name: Jatniel Verastegui MRN:  Darlin Coco   Date: 05/27/2021 Age: 51 y.o.       31231 - NASAL ENDOSCOPY DIAGNOSTIC UNILATERAL OR BILATERAL (AMB ONLY)  Performed by: [id number], MD  Authorized by: Arnold Long, MD     Time Out:     Immediately before the procedure, a time out was called:  Yes    Patient verified:  Yes    Procedure Verified:  Yes    Site Verified:  Yes      Procedure: rigid nasal endoscopy  Pre-operative Dx: CRSwNP  Post-operative Dx: same    Description: bilateral side(s) of the nose anesthetized with Afrin/lidocaine.  A 30-degree rigid endoscope was passed into the nose with the following findings:  Septum: midline   Right and left:  Inferior turbinate: normal   Middle turbinate: in medial position   Polyps: 0  Edema: 0  Drainage: 0  Scarring: 0  Crusting: 0  LK score: 0  NPS: 0      Nasopharynx: no obstruction or mass  lesions    The patient tolerated the procedure well.      Danee Soller A. Arnold Long, MD  Associate Professor  Otolaryngology-Head and Neck Surgery  [facility]        [facility], MD        ASSESSMENT      Assessment/Plan   1. Chronic rhinosinusitis    2. Nasal polyposis    3. Allergic rhinitis    4. Allergic fungal sinusitis      He is doing great.  Follow-up in 1 year.    Orders Placed This Encounter   . Arnold Long - NASAL ENDOSCOPY DIAGNOSTIC UNILATERAL OR BILATERAL (AMB ONLY)       Nalee Lightle A. [id number], MD  Associate Professor  Otolaryngology-Head and Neck Surgery  [facility]    CC:    PCP [facility], MD  [address]  [place]   Referring Provider Self, Referral  No address on file

## 2021-05-27 NOTE — Procedures (Signed)
ENT, [facility]  [place] [address]  Operated by [facility]  Procedure Note    Name: Kijon Littau MRN:  [id number]   Date: 05/27/2021 Age: 51 y.o.       31231 - NASAL ENDOSCOPY DIAGNOSTIC UNILATERAL OR BILATERAL (AMB ONLY)  Performed by: Theodis Blaze, MD  Authorized by: Theodis Blaze, MD     Time Out:     Immediately before the procedure, a time out was called:  Yes    Patient verified:  Yes    Procedure Verified:  Yes    Site Verified:  Yes      Procedure: rigid nasal endoscopy  Pre-operative Dx: CRSwNP  Post-operative Dx: same    Description: bilateral side(s) of the nose anesthetized with Afrin/lidocaine.  A 30-degree rigid endoscope was passed into the nose with the following findings:  Septum: midline   Right and left:  Inferior turbinate: normal   Middle turbinate: in medial position   Polyps: 0  Edema: 0  Drainage: 0  Scarring: 0  Crusting: 0  LK score: 0  NPS: 0      Nasopharynx: no obstruction or mass lesions    The patient tolerated the procedure well.      Dewayne Severe A. Wilder Glade, MD  Associate Professor  Otolaryngology-Head and Neck Surgery  [facility]        Theodis Blaze, [place]

## 2021-07-14 ENCOUNTER — Other Ambulatory Visit: Payer: Self-pay

## 2021-07-14 ENCOUNTER — Inpatient Hospital Stay (HOSPITAL_BASED_OUTPATIENT_CLINIC_OR_DEPARTMENT_OTHER)
Admission: RE | Admit: 2021-07-14 | Discharge: 2021-07-14 | Disposition: A | Discharge status: Home / Self Care | Payer: BC Managed Care – PPO | Source: Ambulatory Visit

## 2021-07-14 ENCOUNTER — Other Ambulatory Visit: Payer: BC Managed Care – PPO

## 2021-07-14 ENCOUNTER — Other Ambulatory Visit (HOSPITAL_COMMUNITY): Payer: Self-pay

## 2021-07-14 DIAGNOSIS — Z01818 Encounter for other preprocedural examination: Secondary | ICD-10-CM

## 2021-07-14 DIAGNOSIS — M5126 Other intervertebral disc displacement, lumbar region: Secondary | ICD-10-CM | POA: Insufficient documentation

## 2021-07-14 LAB — ECG 12 LEAD
Atrial Rate: 71 {beats}/min
Calculated P Axis: 53 degrees
Calculated R Axis: 84 degrees
Calculated T Axis: 76 degrees
PR Interval: 140 ms
QRS Duration: 86 ms
QT Interval: 410 ms
QTC Calculation: 445 ms
Ventricular rate: 71 {beats}/min

## 2021-07-14 LAB — CBC
HCT: 44.1 % (ref 38.9–52.0)
HGB: 15.1 g/dL (ref 13.4–17.5)
MCH: 29.6 pg (ref 26.0–32.0)
MCHC: 34.2 g/dL (ref 31.0–35.5)
MCV: 86.5 fL (ref 78.0–100.0)
MPV: 10.3 fL (ref 8.7–12.5)
PLATELETS: 211 10*3/uL (ref 150–400)
RBC: 5.1 10*6/uL (ref 4.50–6.10)
RDW-CV: 12.8 % (ref 11.5–15.5)
WBC: 9.7 10*3/uL (ref 3.7–11.0)

## 2021-07-14 LAB — BASIC METABOLIC PANEL
ANION GAP: 10 mmol/L (ref 4–13)
BUN/CREA RATIO: 14 (ref 6–22)
BUN: 10 mg/dL (ref 8–25)
CALCIUM: 9.5 mg/dL (ref 8.5–10.0)
CHLORIDE: 103 mmol/L (ref 96–111)
CO2 TOTAL: 28 mmol/L (ref 22–30)
CREATININE: 0.73 mg/dL — ABNORMAL LOW (ref 0.75–1.35)
ESTIMATED GFR: >90 mL/min/BSA (ref >=60)
GLUCOSE: 188 mg/dL — ABNORMAL HIGH (ref 65–125)
POTASSIUM: 3.9 mmol/L (ref 3.5–5.1)
SODIUM: 141 mmol/L (ref 136–145)

## 2021-07-14 LAB — PTT (PARTIAL THROMBOPLASTIN TIME): APTT: 27.3 seconds (ref 23.0–32.0)

## 2021-07-14 LAB — PT/INR
INR: 1.01 (ref 0.90–1.10)
PROTHROMBIN TIME: 10.6 seconds (ref 9.0–13.0)

## 2021-07-15 LAB — MRSA/MSSA CULTURE

## 2021-09-05 ENCOUNTER — Other Ambulatory Visit (HOSPITAL_COMMUNITY): Payer: Self-pay

## 2021-09-05 DIAGNOSIS — S060X0D Concussion without loss of consciousness, subsequent encounter: Secondary | ICD-10-CM

## 2021-09-06 ENCOUNTER — Ambulatory Visit (HOSPITAL_COMMUNITY)
Admission: RE | Admit: 2021-09-06 | Discharge: 2021-09-06 | Disposition: A | Discharge status: Still In Hospital | Payer: BC Managed Care – PPO | Source: Ambulatory Visit

## 2021-09-06 ENCOUNTER — Other Ambulatory Visit: Payer: Self-pay

## 2021-09-06 DIAGNOSIS — S060X0D Concussion without loss of consciousness, subsequent encounter: Secondary | ICD-10-CM
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
## 2021-09-13 ENCOUNTER — Other Ambulatory Visit: Payer: Self-pay

## 2021-09-13 ENCOUNTER — Ambulatory Visit (HOSPITAL_COMMUNITY)
Admission: RE | Admit: 2021-09-13 | Discharge: 2021-09-13 | Disposition: A | Discharge status: Still In Hospital | Payer: BC Managed Care – PPO | Source: Ambulatory Visit

## 2021-09-13 NOTE — Speech Therapy (Signed)
[facility]  Rehabilitation Services  Speech Therapy Progress Note    Patient Name: James Frost  Date of Birth: 23-Jul-1970  Weight:     Room/Bed: Room/bed info not found  Payor: BLUE [employer]OSS BLUE [employer]IELD / Plan: [place] [employer] PPO / Product Type: PPO /      Date/Time of Admission: 09/13/2021 10:58 AM  Admitting Diagnosis:  No admission diagnoses are documented for this encounter.    Subjective:     Patient presented with SO. Reports continued cognitive and physiological symptoms as noted during initial evaluation. Results of RBANS evaluation reviewed in detail.     Objective:   Multiple memory strategies were explained and demonstrated including chunking, habit formation, external aides, personalization, visualization and humor.   Spoon Theory worksheet was provided. Instructions and reasoning for exercise explained in detail.     Assessment:   Patient is to utilize learned memory strategies and Spoon Theory at home in order to compensate for both cognitive and physiological deficits and increase independence. Patient to return to review strategies.       Plan:   Continue POC  Danella Maiers, SLP   Unit #: 4  Treatment Time: 60 minutes

## 2021-09-20 ENCOUNTER — Other Ambulatory Visit: Payer: Self-pay

## 2021-09-20 ENCOUNTER — Ambulatory Visit
Admission: RE | Admit: 2021-09-20 | Discharge: 2021-09-20 | Disposition: A | Discharge status: Still In Hospital | Payer: BC Managed Care – PPO | Source: Ambulatory Visit

## 2021-09-20 NOTE — Speech Therapy (Signed)
[facility]  Rehabilitation Services  Speech Therapy Progress Note    Patient Name: James Frost  Date of Birth: [birth year]  Weight:     Room/Bed: Room/bed info not found  Payor: BLUE [employer]OSS BLUE [employer]IELD / Plan: [place] [employer] PPO / Product Type: PPO /      Date/Time of Admission: 09/20/2021 10:00 AM  Admitting Diagnosis:  No admission diagnoses are documented for this encounter.    Subjective:     Patient presented with SO. Patient states he has a neurology appointment next week. Patient had an earlier appointment today and states he is extremely fatigued with onset of HA.     Objective:   Memory strategies were reviewed.   Patient states he becomes fatigued when attempting to plan and complete new projects (i.e. building a shelf). Reviewed strategies such as writing each step down with time limit per step. Patient states he will attempt this. He also plans to utilize a set, written schedule for daily use.   Patient reviewed use of Spoon theory. Per patient's SO, he often uses all 12 spoons during the first half the day. Now that patient is able to identify the tasks that are most mentally and physically taxing, he is to identify ways he can spread the tasks out throughout the day to minimize exhaustion.     Assessment:   Patient is to continue to utilize learned strategies at home. Plan to follow up in two weeks to discuss progress and make modifications.       Plan:   Continue POC  Danella Maiers, SLP   Unit #: 4  Treatment Time: 60 minutes

## 2021-10-04 ENCOUNTER — Encounter (HOSPITAL_COMMUNITY): Payer: Self-pay

## 2021-10-10 ENCOUNTER — Ambulatory Visit
Admission: RE | Admit: 2021-10-10 | Discharge: 2021-10-10 | Disposition: A | Discharge status: Still In Hospital | Payer: BC Managed Care – PPO | Source: Ambulatory Visit

## 2021-10-10 ENCOUNTER — Other Ambulatory Visit: Payer: Self-pay

## 2021-10-10 DIAGNOSIS — S060X0D Concussion without loss of consciousness, subsequent encounter: Secondary | ICD-10-CM | POA: Insufficient documentation

## 2021-10-10 NOTE — Speech Therapy (Signed)
[facility]  Speech Therapy Progress Note    Patient Name: James Frost  Date of Birth: March 27, 1970  Weight:     Room/Bed: Room/bed info not found  Payor: [place] / Plan: [place] PPO / Product Type: PPO /      Date/Time of Admission: 10/10/2021 10:00 AM  Admitting Diagnosis:  No admission diagnoses are documented for this encounter.    Subjective:     Patient presented with SO. Patient states he had a neurology appointment last week however he is going to wait for second opinion at neurology clinical which is scheduled for December. Patient reports loss of motivation, continued poor STM, and increased drowsiness. Patient reports his medications have been adjusted.     Objective:   Memory strategies were reviewed including taking notes and utilizing daily schedule. Patient states he is able to utilize these strategies however feels he lacks motivation to utilize strategies.       Assessment:   Patient is to continue to utilize learned strategies at home. Plan to follow up in two weeks to discuss progress and make modifications.       Plan:   Continue [place], SLP   Unit #: 3  Treatment Time: 45 minutes

## 2021-10-31 ENCOUNTER — Encounter (HOSPITAL_COMMUNITY): Payer: Self-pay

## 2021-12-20 ENCOUNTER — Ambulatory Visit (INDEPENDENT_AMBULATORY_CARE_PROVIDER_SITE_OTHER): Payer: Self-pay | Admitting: Anesthesiology

## 2021-12-23 ENCOUNTER — Other Ambulatory Visit (HOSPITAL_COMMUNITY): Payer: Self-pay

## 2021-12-23 DIAGNOSIS — R519 Headache, unspecified: Secondary | ICD-10-CM

## 2021-12-23 DIAGNOSIS — R42 Dizziness and giddiness: Secondary | ICD-10-CM

## 2021-12-27 ENCOUNTER — Encounter (HOSPITAL_COMMUNITY): Payer: Self-pay

## 2022-01-30 ENCOUNTER — Other Ambulatory Visit: Payer: Self-pay

## 2022-01-30 ENCOUNTER — Inpatient Hospital Stay
Admission: RE | Admit: 2022-01-30 | Discharge: 2022-01-30 | Disposition: A | Discharge status: Home / Self Care | Payer: BC Managed Care – PPO | Source: Ambulatory Visit

## 2022-01-30 DIAGNOSIS — R519 Headache, unspecified: Secondary | ICD-10-CM

## 2022-01-30 DIAGNOSIS — R9082 White matter disease, unspecified: Secondary | ICD-10-CM

## 2022-01-30 DIAGNOSIS — R42 Dizziness and giddiness: Secondary | ICD-10-CM

## 2022-01-30 MED ORDER — GADOBUTROL 1 MMOL/ML (604.72 MG/ML) INTRAVENOUS SOLUTION
10.0000 mL | INTRAVENOUS | Status: AC
Start: 2022-01-30 — End: 2022-01-30
  Administered 2022-01-30: 10 mL via INTRAVENOUS

## 2022-06-02 ENCOUNTER — Ambulatory Visit (INDEPENDENT_AMBULATORY_CARE_PROVIDER_SITE_OTHER): Payer: 59 | Admitting: Otolaryngology

## 2022-06-02 ENCOUNTER — Other Ambulatory Visit: Payer: Self-pay

## 2022-06-02 VITALS — BP 148/88 | HR 101 | Temp 96.9°F | Ht 71.0 in | Wt 236.8 lb

## 2022-06-02 DIAGNOSIS — G4730 Sleep apnea, unspecified: Secondary | ICD-10-CM

## 2022-06-02 DIAGNOSIS — R0683 Snoring: Secondary | ICD-10-CM

## 2022-06-02 DIAGNOSIS — B49 Unspecified mycosis: Secondary | ICD-10-CM

## 2022-06-02 DIAGNOSIS — J309 Allergic rhinitis, unspecified: Secondary | ICD-10-CM

## 2022-06-02 DIAGNOSIS — Z9889 Other specified postprocedural states: Secondary | ICD-10-CM

## 2022-06-02 DIAGNOSIS — J329 Chronic sinusitis, unspecified: Secondary | ICD-10-CM

## 2022-06-02 DIAGNOSIS — J339 Nasal polyp, unspecified: Secondary | ICD-10-CM

## 2022-06-02 NOTE — Progress Notes (Signed)
[facility]  OTOLARYNGOLOGY DEPARTMENT    Follow up    NAME:  James Frost  MRN:  [id number]  DOB:  08/26/1970  DOS:  06/02/2022    Subjective  James Frost is a 52 y.o. 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 doing well.  He is not using any rinses or allergy pills or Singulair.  He is not on allergy immunotherapy.  He has been having symptoms of sleep apnea with snoring and fatigue and pauses in his sleep.          SNOT-22 score: 2    Objective    Physical Exam  Vitals:    06/02/22 1245   BP: (!) 148/88   Pulse: (!) 101   Temp: 36.1 C (96.9 F)   TempSrc: Temporal   SpO2: 96%   Weight: 107 kg (236 lb 12.4 oz)   Height: 1.803 m (5\' 11" )   BMI: 33.09         General Appearance: Pleasant, cooperative, healthy, and in no acute distress.  Eyes: Conjunctivae/corneas clear, EOM's intact.  Head and Face: Normocephalic, atraumatic.  Face symmetric, no obvious lesions.   Pinnae: Normal shape and position.   Nose:  External pyramid midline. See scope note.   Neck:  No palpable thyroid, salivary gland, or neck masses.  Psychiatric:  Alert and oriented x 3.    ENT, [facility] ENT  [address]  [place] [address]    Procedure Note    Name: James Frost MRN:  [id number]   Date: 06/02/2022 DOB:  19-Feb-1970 (51 y.o.)         31231 - NASAL ENDOSCOPY DIAGNOSTIC UNILATERAL OR BILATERAL (AMB ONLY)    Performed by: Arnold Long, MD  Authorized by: Arnold Long, MD    Time Out:     Immediately before the procedure, a time out was called:  Yes    Patient verified:  Yes    Procedure Verified:  Yes    Site Verified:  Yes    Procedure: rigid nasal endoscopy  Pre-operative Dx: s/p FESS  Post-operative Dx: same    Description: bilateral side(s) of the nose anesthetized with Afrin/lidocaine.  A 30-degree rigid endoscope was passed into the nose with the following findings:  Septum: midline  Right and left:  Inferior turbinate: normal   Middle turbinate: in  medial position   Polyps: 0  Edema: 0  Drainage: 0    LK score: 0  NPS: 0    Nasopharynx: no obstruction or mass lesions    The patient tolerated the procedure well.      Onyinyechi Huante A. Candelaria Celeste, MD  Associate Professor  Otolaryngology-Head and Neck Surgery  [facility]        Arnold Long, MD    ASSESSMENT      Assessment/Plan   1. Chronic rhinosinusitis    2. Nasal polyposis    3. S/P FESS (functional endoscopic sinus surgery)    4. Allergic rhinitis    5. Allergic fungal sinusitis    6. Sleep disorder breathing      Follow up with me as needed.  Sleep study and then follow-up with sleep medicine after that.    Orders Placed This Encounter    [phone] - NASAL ENDOSCOPY DIAGNOSTIC UNILATERAL OR BILATERAL (AMB ONLY)    POLYSOMNOGRAPHY - ANP OVERNIGHT - 1ST NIGHT OF STUDY (F/U WITH CPAP IF ANP MEETS CLINICAL CRITERIA)  Grae Leathers A. Candelaria Celeste, MD  Associate Professor  Otolaryngology-Head and Neck Surgery  [facility]    CC:    PCP Sharol Harness, MD  [address]  [place] [address]   Referring Provider Self, Referral  No address on file

## 2022-06-02 NOTE — Procedures (Signed)
ENT, [facility]NT  [address]  [place] [address]    Procedure Note    Name: James Frost MRN:  [id number]   Date: 06/02/2022 DOB:  September 29, 1970 (51 y.o.)         31231 - NASAL ENDOSCOPY DIAGNOSTIC UNILATERAL OR BILATERAL (AMB ONLY)    Performed by: Arnold Long, MD  Authorized by: Arnold Long, MD    Time Out:     Immediately before the procedure, a time out was called:  Yes    Patient verified:  Yes    Procedure Verified:  Yes    Site Verified:  Yes    Procedure: rigid nasal endoscopy  Pre-operative Dx: s/p FESS  Post-operative Dx: same    Description: bilateral side(s) of the nose anesthetized with Afrin/lidocaine.  A 30-degree rigid endoscope was passed into the nose with the following findings:  Septum: midline  Right and left:  Inferior turbinate: normal   Middle turbinate: in medial position   Polyps: 0  Edema: 0  Drainage: 0    LK score: 0  NPS: 0    Nasopharynx: no obstruction or mass lesions    The patient tolerated the procedure well.      Ciarah Peace A. Candelaria Celeste, MD  Associate Professor  Otolaryngology-Head and Neck Surgery  [facility]        Arnold Long, [place]

## 2022-06-12 ENCOUNTER — Other Ambulatory Visit (HOSPITAL_COMMUNITY): Payer: Self-pay

## 2022-06-12 DIAGNOSIS — I10 Essential (primary) hypertension: Secondary | ICD-10-CM

## 2022-06-12 DIAGNOSIS — Z0189 Encounter for other specified special examinations: Secondary | ICD-10-CM

## 2022-08-02 ENCOUNTER — Other Ambulatory Visit (HOSPITAL_COMMUNITY): Payer: Self-pay

## 2022-08-02 DIAGNOSIS — Z0189 Encounter for other specified special examinations: Secondary | ICD-10-CM

## 2022-08-02 DIAGNOSIS — I10 Essential (primary) hypertension: Secondary | ICD-10-CM

## 2022-08-08 ENCOUNTER — Other Ambulatory Visit: Payer: Self-pay

## 2022-08-08 ENCOUNTER — Ambulatory Visit (HOSPITAL_COMMUNITY)
Admission: RE | Admit: 2022-08-08 | Discharge: 2022-08-08 | Disposition: A | Discharge status: Home / Self Care | Payer: 59 | Source: Ambulatory Visit

## 2022-08-08 ENCOUNTER — Ambulatory Visit (HOSPITAL_COMMUNITY): Payer: Self-pay | Admitting: NUCLEAR MEDICINE

## 2022-08-08 ENCOUNTER — Ambulatory Visit (HOSPITAL_COMMUNITY): Payer: Self-pay

## 2022-08-08 ENCOUNTER — Ambulatory Visit
Admission: RE | Admit: 2022-08-08 | Discharge: 2022-08-08 | Disposition: A | Discharge status: Home / Self Care | Payer: 59 | Source: Ambulatory Visit

## 2022-08-08 DIAGNOSIS — I10 Essential (primary) hypertension: Secondary | ICD-10-CM | POA: Insufficient documentation

## 2022-08-08 DIAGNOSIS — Z0189 Encounter for other specified special examinations: Secondary | ICD-10-CM | POA: Insufficient documentation

## 2022-08-08 MED ORDER — SODIUM CHLORIDE 0.9 % INJECTION SOLUTION
2.0000 mL | Freq: Once | INTRAVENOUS | Status: DC | PRN
Start: 2022-08-08 — End: 2022-08-09
  Administered 2022-08-08: 2 mL via INTRAVENOUS

## 2022-08-14 ENCOUNTER — Other Ambulatory Visit: Payer: Self-pay

## 2022-08-14 ENCOUNTER — Encounter (HOSPITAL_COMMUNITY): Payer: Self-pay

## 2022-08-14 ENCOUNTER — Emergency Department
Admission: EM | Admit: 2022-08-14 | Discharge: 2022-08-14 | Disposition: A | Discharge status: Home / Self Care | Payer: 59 | Attending: Emergency Medicine | Admitting: Emergency Medicine

## 2022-08-14 DIAGNOSIS — K219 Gastro-esophageal reflux disease without esophagitis: Secondary | ICD-10-CM

## 2022-08-14 DIAGNOSIS — Z79899 Other long term (current) drug therapy: Secondary | ICD-10-CM

## 2022-08-14 DIAGNOSIS — E119 Type 2 diabetes mellitus without complications: Secondary | ICD-10-CM

## 2022-08-14 LAB — COMPREHENSIVE METABOLIC PANEL, NON-FASTING
ALBUMIN: 4 g/dL (ref 3.5–5.0)
ALKALINE PHOSPHATASE: 74 U/L (ref 45–115)
ALT (SGPT): 77 U/L — ABNORMAL HIGH (ref 10–55)
ANION GAP: 13 mmol/L (ref 4–13)
AST (SGOT): 99 U/L — ABNORMAL HIGH (ref 8–45)
BILIRUBIN TOTAL: 0.5 mg/dL (ref 0.3–1.3)
BUN/CREA RATIO: 16 (ref 6–22)
BUN: 13 mg/dL (ref 8–25)
CALCIUM: 10 mg/dL (ref 8.6–10.2)
CHLORIDE: 103 mmol/L (ref 96–111)
CO2 TOTAL: 22 mmol/L (ref 22–30)
CREATININE: 0.83 mg/dL (ref 0.75–1.35)
ESTIMATED GFR - MALE: >90 mL/min/BSA (ref >=60)
GLUCOSE: 170 mg/dL — ABNORMAL HIGH (ref 65–125)
POTASSIUM: 3.4 mmol/L — ABNORMAL LOW (ref 3.5–5.1)
PROTEIN TOTAL: 7.6 g/dL (ref 6.4–8.3)
SODIUM: 138 mmol/L (ref 136–145)

## 2022-08-14 LAB — URINALYSIS, MICROSCOPIC: BACTERIA: NEGATIVE /hpf

## 2022-08-14 LAB — URINALYSIS, MACRO/MICRO
BILIRUBIN: NEGATIVE mg/dL
BLOOD: NEGATIVE mg/dL
GLUCOSE: NEGATIVE mg/dL
KETONES: NEGATIVE mg/dL
LEUKOCYTES: NEGATIVE WBCs/uL
NITRITE: NEGATIVE
PH: 5 (ref 5.0–9.0)
SPECIFIC GRAVITY: 1.023 (ref 1.001–1.030)
UROBILINOGEN: 0.2 mg/dL (ref 0.2–1.0)

## 2022-08-14 LAB — MAGNESIUM: MAGNESIUM: 1 mg/dL — CL (ref 1.8–2.6)

## 2022-08-14 LAB — CBC WITH DIFF
BASOPHIL #: <0.1 10*3/uL (ref <=0.20)
BASOPHIL %: 1 %
EOSINOPHIL #: 0.49 10*3/uL (ref <=0.50)
EOSINOPHIL %: 5 %
HCT: 44.3 % (ref 38.9–52.0)
HGB: 15.5 g/dL (ref 13.4–17.5)
IMMATURE GRANULOCYTE #: <0.1 10*3/uL (ref <0.10)
IMMATURE GRANULOCYTE %: 0 % (ref 0.0–1.0)
LYMPHOCYTE #: 4.4 10*3/uL (ref 1.00–4.80)
LYMPHOCYTE %: 41 %
MCH: 30.2 pg (ref 26.0–32.0)
MCHC: 35 g/dL (ref 31.0–35.5)
MCV: 86.2 fL (ref 78.0–100.0)
MONOCYTE #: 0.58 10*3/uL (ref 0.20–1.10)
MONOCYTE %: 5 %
MPV: 11 fL (ref 8.7–12.5)
NEUTROPHIL #: 5.27 10*3/uL (ref 1.50–7.70)
NEUTROPHIL %: 48 %
PLATELETS: 242 10*3/uL (ref 150–400)
RBC: 5.14 10*6/uL (ref 4.50–6.10)
RDW-CV: 12.3 % (ref 11.5–15.5)
WBC: 10.8 10*3/uL (ref 3.7–11.0)

## 2022-08-14 LAB — LIPASE: LIPASE: 15 U/L (ref 10–60)

## 2022-08-14 LAB — BLUE TOP TUBE

## 2022-08-14 LAB — RED TOP TUBE

## 2022-08-14 MED ORDER — MAGNESIUM SULFATE 2 GRAM/50 ML (4 %) IN WATER INTRAVENOUS PIGGYBACK
2.0000 g | INJECTION | INTRAVENOUS | Status: AC
Start: 2022-08-14 — End: 2022-08-14
  Administered 2022-08-14: 2 g via INTRAVENOUS
  Administered 2022-08-14: 0 g via INTRAVENOUS
  Filled 2022-08-14: qty 50

## 2022-08-14 MED ORDER — MAGNESIUM OXIDE 400 MG (241.3 MG MAGNESIUM) TABLET
400.0000 mg | ORAL_TABLET | Freq: Two times a day (BID) | ORAL | 1 refills | Status: AC
Start: 2022-08-14 — End: ?

## 2022-08-14 NOTE — ED Triage Notes (Signed)
Pt had stress test done and labs, was told that mag was low and needs to come to the ER

## 2022-08-14 NOTE — ED Provider Notes (Signed)
[facility]       Name: James Frost  Age and Gender: 52 y.o. male  PCP: Chapman Fitch, DO    Chief Complaint:  Chief Complaint   Patient presents with    Abnormal Lab Result     History of Presenting Illness     James Frost, date of birth [birth year], is a 52 y.o. male who presents with  history of diabetes history of reflux disease, on chronic PPI therapy    Patient was told him recent screening labs that he had hypomagnesemia and that he needed to come over for an infusion.  He denies any paresthesias or weakness or muscle cramps tells me he had this 1 time in the distant past but was never encouraged to be on medication he was actually in the hospital for it but says he never knew an etiology he has been on omeprazole for many years.        HPI    Review of System    Review of Systems   Constitutional:  Negative for fever.   Respiratory:  Negative for shortness of breath.    Cardiovascular:  Negative for chest pain.   Gastrointestinal:  Negative for nausea and vomiting.   Musculoskeletal:  Negative for arthralgias and myalgias.   Skin:  Negative for rash.   Neurological:  Negative for headaches.        Histories   Past Medical History:  Past Medical History:   Diagnosis Date    Allergic rhinitis due to dogs 10/08/2019    Diabetes mellitus, type 2 (CMS HCC)     Esophageal reflux     Headache     Hx of LASIK        Past Surgical History:  Past Surgical History:   Procedure Laterality Date    Hx appendectomy      Hx back surgery      Hx cholecystectomy      Hx tonsillectomy      Hx vasectomy      Hx wisdom teeth extraction      Lasik      Penis surgery      Sinus surgery         Family History:   Family History   Problem Relation Age of Onset    Diabetes Mother     Diabetes Father     Diabetes Maternal Grandmother     Cancer Paternal Grandfather        Social history:  Social History     Tobacco Use    Smoking status: Every Day     Current packs/day: 1.00     Average packs/day: 1 pack/day for 31.0  years (31.0 ttl pk-yrs)     Types: Cigarettes    Smokeless tobacco: Current     Types: Chew    Tobacco comments:     Trying to quit   Vaping Use    Vaping status: Never Used   Substance Use Topics    Alcohol use: Yes     Alcohol/week: 1.0 standard drink of alcohol     Types: 1 Cans of beer per week     Comment: social    Drug use: Never     Social History     Substance and Sexual Activity   Drug Use Never     PCP: Chapman Fitch, DO    Allergies:   Allergies   Allergen Reactions    Shellfish [Shrimp]  Physical Exam      Filed Vitals:    08/14/22 1806 08/14/22 1813   BP: (!) 139/90    Pulse: 93    Resp: 19    Temp:  36.1 C (97 F)   SpO2: 98%      Physical Exam  Vitals and nursing note reviewed.   Constitutional:       Appearance: Normal appearance.   HENT:      Head: Normocephalic and atraumatic.      Right Ear: External ear normal.      Left Ear: External ear normal.      Mouth/Throat:      Mouth: Mucous membranes are moist.      Pharynx: Oropharynx is clear.   Eyes:      Conjunctiva/sclera: Conjunctivae normal.   Cardiovascular:      Rate and Rhythm: Normal rate and regular rhythm.   Pulmonary:      Effort: Pulmonary effort is normal.      Breath sounds: Normal breath sounds.   Musculoskeletal:         General: Normal range of motion.      Cervical back: Neck supple. No rigidity.   Skin:     General: Skin is warm and dry.   Neurological:      Mental Status: He is alert and oriented to person, place, and time.   Psychiatric:         Behavior: Behavior normal.          Orders     Orders Placed This Encounter    CBC/DIFF    COMPREHENSIVE METABOLIC PANEL, NON-FASTING    LIPASE    URINALYSIS WITH REFLEX MICROSCOPIC AND CULTURE IF POSITIVE    MAGNESIUM    CBC WITH DIFF    URINALYSIS, MACRO/MICRO    RAINBOW DRAW - UTN ONLY    BLUE TOP TUBE    RED TOP TUBE    URINALYSIS, MICROSCOPIC    CANCELED: INSERT & MAINTAIN PERIPHERAL IV ACCESS    magnesium sulfate 2 G in SW 50 mL premix IVPB    magnesium oxide (MAG-OX) 400 mg  Oral Tablet       Diagnostics     Labs:  Labs reviewed and interpreted by me.  Results for orders placed or performed during the hospital encounter of 08/14/22 (from the past 12 hour(s))   COMPREHENSIVE METABOLIC PANEL, NON-FASTING   Result Value Ref Range    SODIUM 138 136 - 145 mmol/L    POTASSIUM 3.4 (L) 3.5 - 5.1 mmol/L    CHLORIDE 103 96 - 111 mmol/L    CO2 TOTAL 22 22 - 30 mmol/L    ANION GAP 13 4 - 13 mmol/L    BUN 13 8 - 25 mg/dL    CREATININE 4.78 2.95 - 1.35 mg/dL    BUN/CREA RATIO 16 6 - 22    ALBUMIN 4.0 3.5 - 5.0 g/dL     CALCIUM 62.1 8.6 - 10.2 mg/dL    GLUCOSE 308 (H) 65 - 125 mg/dL    ALKALINE PHOSPHATASE 74 45 - 115 U/L    ALT (SGPT) 77 (H) 10 - 55 U/L    AST (SGOT)  99 (H) 8 - 45 U/L    BILIRUBIN TOTAL 0.5 0.3 - 1.3 mg/dL    PROTEIN TOTAL 7.6 6.4 - 8.3 g/dL    ESTIMATED GFR - MALE >90 >=60 mL/min/BSA   LIPASE   Result Value Ref Range    LIPASE 15 10 -  60 U/L   MAGNESIUM   Result Value Ref Range    MAGNESIUM 1.0 (LL) 1.8 - 2.6 mg/dL   CBC WITH DIFF   Result Value Ref Range    WBC 10.8 3.7 - 11.0 x10^3/uL    RBC 5.14 4.50 - 6.10 x10^6/uL    HGB 15.5 13.4 - 17.5 g/dL    HCT 10.2 72.5 - 36.6 %    MCV 86.2 78.0 - 100.0 fL    MCH 30.2 26.0 - 32.0 pg    MCHC 35.0 31.0 - 35.5 g/dL    RDW-CV 44.0 34.7 - 42.5 %    PLATELETS 242 150 - 400 x10^3/uL    MPV 11.0 8.7 - 12.5 fL    NEUTROPHIL % 48.0 %    LYMPHOCYTE % 41.0 %    MONOCYTE % 5.0 %    EOSINOPHIL % 5.0 %    BASOPHIL % 1.0 %    NEUTROPHIL # 5.27 1.50 - 7.70 x10^3/uL    LYMPHOCYTE # 4.40 1.00 - 4.80 x10^3/uL    MONOCYTE # 0.58 0.20 - 1.10 x10^3/uL    EOSINOPHIL # 0.49 <=0.50 x10^3/uL    BASOPHIL # <0.10 <=0.20 x10^3/uL    IMMATURE GRANULOCYTE % 0.0 0.0 - 1.0 %    IMMATURE GRANULOCYTE # <0.10 <0.10 x10^3/uL   BLUE TOP TUBE   Result Value Ref Range    RAINBOW/EXTRA TUBE AUTO RESULT Yes    RED TOP TUBE   Result Value Ref Range    RAINBOW/EXTRA TUBE AUTO RESULT Yes    URINALYSIS, MACRO/MICRO   Result Value Ref Range    COLOR Yellow Yellow    APPEARANCE Clear  Clear    SPECIFIC GRAVITY 1.023 1.001 - 1.030    PH 5.0 5.0 - 9.0    PROTEIN Trace (A) Negative mg/dL    GLUCOSE Negative Negative mg/dL    KETONES Negative Negative mg/dL    UROBILINOGEN 0.2 0.2 - 1.0 mg/dL    BILIRUBIN Negative Negative mg/dL    BLOOD Negative Negative mg/dL    NITRITE Negative Negative    LEUKOCYTES Negative Negative WBCs/uL   URINALYSIS, MICROSCOPIC   Result Value Ref Range    SQUAMOUS EPITHELIAL Rare (A) None /hpf    WBCS 0-1 (A) None /hpf    RBCS 0-2 (A) None /hpf    BACTERIA Negative Negative /hpf    HYALINE CASTS 0-1 (A) None /lpf       Radiology:  No orders to display       EKG:   No results found for this visit on 08/14/22 (from the past 720 hour(s)).    ED Course/MDM     During the patient's stay in the emergency department, the above listed imaging and/or labs were performed to assist with medical decision making and were reviewed by myself when available for review.   Patient rechecked and remained stable throughout remainder of emergency department course.   All questions/concerns addressed, and patient agrees with disposition plan.      Medical Decision Making  Hypomagnesemia secondary likely to PPI therapy peer  I have recommend he try to reduce his PPI therapy and alternate with the H2 blockers or he can certainly take chronic Mag-Ox.    Received an infusion here, appears not to have symptoms.  Understands the importance of making sure to get this followed up    Amount and/or Complexity of Data Reviewed  External Data Reviewed: labs.    Risk  OTC drugs.  Prescription drug management.  ED Course:    Results:    Evaluation / Plan:        ED Course as of 08/15/22 0241   Mon Aug 14, 2022   1916 Temperature: 36.1 C (97 F)       Medications given during ED stay include:  Medications Administered in the ED   magnesium sulfate 2 G in SW 50 mL premix IVPB (0 g Intravenous Stopped 08/14/22 2035)       Clinical Impression:   Clinical Impression   Hypomagnesemia (Primary)          Disposition: Discharged      Krystal Clark, MD  08/14/2022, 19:51

## 2022-08-14 NOTE — Discharge Instructions (Addendum)
Could consider alternating your omeprazole with over-the-counter generic famotidine/Pepcid (20 mg twice a day on the famotidine Pepcid days).    Otherwise take magnesium as directed

## 2022-08-29 ENCOUNTER — Ambulatory Visit (HOSPITAL_COMMUNITY): Payer: Self-pay | Admitting: Internal Medicine

## 2022-09-04 ENCOUNTER — Encounter (HOSPITAL_COMMUNITY): Payer: Self-pay | Admitting: Internal Medicine

## 2022-09-04 ENCOUNTER — Other Ambulatory Visit: Payer: Self-pay

## 2022-09-04 ENCOUNTER — Ambulatory Visit: Payer: 59 | Attending: Internal Medicine | Admitting: Internal Medicine

## 2022-09-04 VITALS — BP 136/78 | HR 113 | Temp 98.6°F | Resp 16 | Ht 71.0 in | Wt 235.4 lb

## 2022-09-04 DIAGNOSIS — G4733 Obstructive sleep apnea (adult) (pediatric): Secondary | ICD-10-CM | POA: Insufficient documentation

## 2022-09-04 DIAGNOSIS — R0683 Snoring: Secondary | ICD-10-CM

## 2022-09-04 DIAGNOSIS — Z87891 Personal history of nicotine dependence: Secondary | ICD-10-CM

## 2022-09-04 DIAGNOSIS — R400 Somnolence: Secondary | ICD-10-CM

## 2022-09-04 NOTE — Nursing Note (Signed)
Neck Circ- 17.50 inches

## 2022-09-04 NOTE — H&P (Signed)
PULMONARY, [facility]  Operated by [facility]  [address] [place] [address]  Dept: [phone]  Dept Fax: [phone]   PULMONARY OFFICE VISIT  James Frost, James Frost, 52 y.o. male  Date of Birth:  1970/06/25  Date of service: 09/04/2022  ASSESSMENT AND PLAN    ASSESSMENT:  Clinical concern for Obstructive Sleep Apnea (OSA) in the setting of snoring, daytime sleepiness, elevated BMI, and crowded OP.    RECOMMENDATIONS:  - Home sleep study. High clinical suspicion - if HST negative, will get in lab PSG  - AutoCPAP prescription to follow if appropriate  - Patient counseled to obtain at least 7-8 hours of sleep each night, avoid alcohol and electronic screen exposure prior to bedtime, avoid caffeine intake after 12 noon, maintain restful bedtime environment, utilize bed only for sleep and sex    Sleep apnea is a condition that can lead to excessive daytime sleepiness. For the safety of yourself, as well as others, it is strongly recommended not to drive or operate heavy machinery when drowsy or sleepy.      Return to clinic in: 3 months    Lennox Pippins, MD    HISTORY     CHIEF COMPLAINT:  sleep consult    HISTORY OF PRESENT ILLNESS:    James Frost is a 52 y.o. male who presents to the pulmonary office as a new patient for further evaluation of sleep consult.   He has a known history of hypertension    Snoring, observed apneas by his girlfriend, excessive daytime sleepiness. He reports headaches. He does not feel refreshed on waking up in the morning. These are symptoms ongoing since many years.    Sleep History: none  Most recent sleep study date: none available  AHI: n/a  RDI: n/a  Type of therapy: not applicable  Compliance: n/a  Epworth Sleepiness Scale: 16        09/04/2022     1:00 PM   Epworth Sleepiness Scale   Sitting and Reading 2   Watching TV 3   Sitting inactive in a public place. 2   As a passenger in a car for an hour without a break. 3   Lying down to rest in the  afternoon when circumstances permit. 3   Sitting and Talking to someone 1   Sitting quietly after a lunch without alcohol 2   In a car, while stopped for a few minutes in traffic 0   Score total 16         09/04/2022     1:00 PM   Stop Bang Assessment   Do you snore loudly 1   Do you often feel tired, fatigued, or sleepy during the day 1   Has anyone observed you stop breathing during your sleep 1   Do you have or are you being treated for high BP 1   STOP Score 4   Are you obese/very overweight-BMI>35kg/m2 0   Age of 52 years old 1   Neck circumference >16 inches 1   Are you male 1   STOP BANG Total Score 7       Past Medical History:   Diagnosis Date    Allergic rhinitis due to dogs 10/08/2019    Diabetes mellitus, type 2 (CMS HCC)     Esophageal reflux     Headache     Hx of LASIK      Past Surgical History:   Procedure Laterality Date  HX APPENDECTOMY      HX BACK SURGERY      HX CHOLECYSTECTOMY      HX TONSILLECTOMY      HX VASECTOMY      HX WISDOM TEETH EXTRACTION      LASIK      PENIS SURGERY      SINUS SURGERY        Cannot display prior to admission medications because the patient has not been admitted in this contact.          No current facility-administered medications for this visit.    Allergies   Allergen Reactions    Shellfish [Shrimp]      Social History     Tobacco Use    Smoking status: Every Day     Current packs/day: 1.00     Average packs/day: 1 pack/day for 31.0 years (31.0 ttl pk-yrs)     Types: Cigarettes    Smokeless tobacco: Current     Types: Chew    Tobacco comments:     Trying to quit   Substance Use Topics    Alcohol use: Yes     Alcohol/week: 1.0 standard drink of alcohol     Types: 1 Cans of beer per week     Comment: social       Family History:  relevant history summarized above in the HPI.   No other family history of pulmonary significance.     ROS:   Other than ROS in the HPI, all other systems were negative.    EXAM:  Temperature: 37 C (98.6 F)  Heart Rate: (!) 113  BP  (Non-Invasive): 136/78  Respiratory Rate: 16  SpO2: 96 %  General: does not appear in acute distress  HEENT: Atraumatic normocephalic.   Neck: Trachea in the midline. Supple.   Cardiovascular: S1-S2 regular. No murmur.  Lungs: Good air entry bilaterally. No rales rhonchi or wheezing.  Abdomen: Soft, nontender. No rebound or guarding. Active bowel sounds.  Musculoskeletal: No joint swelling or erythema or tenderness  Extremities: No edema clubbing or cyanosis.  Skin: Dry, warm to touch. Normal turgor.   Neurological: Awake, alert , oriented. Nonfocal.    Labs:  No results found for this or any previous visit (from the past 24 hour(s)).     Imaging Studies:        Total time spent on encounter was 62 minutes. In addition to face to face time with the patient performing history and exam, this includes time spent before and after the visit reviewing records, personally reviewing prior imaging, reviewing prior cardiac studies as available, reviewing prior outpatient notes as available, reviewing the patient's medication record, coordinating with office staff and time spent performing final documentation in the medical record.    Lennox Pippins, MD

## 2022-09-06 ENCOUNTER — Ambulatory Visit (HOSPITAL_COMMUNITY): Payer: Self-pay | Admitting: Internal Medicine

## 2022-09-15 ENCOUNTER — Other Ambulatory Visit (HOSPITAL_COMMUNITY): Payer: Self-pay

## 2022-09-15 DIAGNOSIS — I429 Cardiomyopathy, unspecified: Secondary | ICD-10-CM

## 2022-09-21 ENCOUNTER — Other Ambulatory Visit (HOSPITAL_COMMUNITY): Payer: Self-pay | Admitting: Internal Medicine

## 2022-09-21 DIAGNOSIS — G4733 Obstructive sleep apnea (adult) (pediatric): Secondary | ICD-10-CM

## 2022-09-26 ENCOUNTER — Telehealth (HOSPITAL_COMMUNITY): Payer: Self-pay | Admitting: Student in an Organized Health Care Education/Training Program

## 2022-09-26 NOTE — Telephone Encounter (Signed)
Left message to reschedule sleep study on 10/03/22 as there is no sleep tech available.

## 2022-10-03 ENCOUNTER — Ambulatory Visit (HOSPITAL_COMMUNITY): Payer: Self-pay

## 2022-10-08 ENCOUNTER — Ambulatory Visit (HOSPITAL_COMMUNITY): Payer: Self-pay

## 2022-10-19 ENCOUNTER — Other Ambulatory Visit: Payer: Self-pay

## 2022-10-19 ENCOUNTER — Ambulatory Visit
Admission: RE | Admit: 2022-10-19 | Discharge: 2022-10-19 | Disposition: A | Discharge status: Home / Self Care | Payer: 59 | Source: Ambulatory Visit | Attending: Internal Medicine | Admitting: Internal Medicine

## 2022-10-19 DIAGNOSIS — G4733 Obstructive sleep apnea (adult) (pediatric): Secondary | ICD-10-CM | POA: Insufficient documentation

## 2022-10-19 DIAGNOSIS — G4761 Periodic limb movement disorder: Secondary | ICD-10-CM

## 2022-11-06 ENCOUNTER — Other Ambulatory Visit (HOSPITAL_COMMUNITY): Payer: Self-pay | Admitting: Internal Medicine

## 2022-11-06 DIAGNOSIS — G4733 Obstructive sleep apnea (adult) (pediatric): Secondary | ICD-10-CM

## 2022-11-09 ENCOUNTER — Ambulatory Visit
Admission: RE | Admit: 2022-11-09 | Discharge: 2022-11-09 | Disposition: A | Discharge status: Home / Self Care | Payer: 59 | Source: Ambulatory Visit | Attending: Internal Medicine | Admitting: Internal Medicine

## 2022-11-09 ENCOUNTER — Other Ambulatory Visit: Payer: Self-pay

## 2022-11-09 DIAGNOSIS — R569 Unspecified convulsions: Secondary | ICD-10-CM

## 2022-11-09 DIAGNOSIS — G4733 Obstructive sleep apnea (adult) (pediatric): Secondary | ICD-10-CM | POA: Insufficient documentation

## 2022-11-10 ENCOUNTER — Other Ambulatory Visit (HOSPITAL_COMMUNITY): Payer: Self-pay | Admitting: Internal Medicine

## 2022-11-10 ENCOUNTER — Other Ambulatory Visit (HOSPITAL_COMMUNITY): Payer: Self-pay

## 2022-11-10 DIAGNOSIS — Z122 Encounter for screening for malignant neoplasm of respiratory organs: Secondary | ICD-10-CM

## 2022-11-10 DIAGNOSIS — F17210 Nicotine dependence, cigarettes, uncomplicated: Secondary | ICD-10-CM

## 2022-11-10 DIAGNOSIS — G4733 Obstructive sleep apnea (adult) (pediatric): Secondary | ICD-10-CM

## 2022-11-13 ENCOUNTER — Encounter (HOSPITAL_BASED_OUTPATIENT_CLINIC_OR_DEPARTMENT_OTHER): Payer: Self-pay

## 2022-11-14 ENCOUNTER — Ambulatory Visit (HOSPITAL_COMMUNITY): Payer: Self-pay | Admitting: Internal Medicine

## 2022-11-16 ENCOUNTER — Telehealth (HOSPITAL_COMMUNITY): Payer: Self-pay

## 2022-11-16 NOTE — Telephone Encounter (Signed)
Left message to repeat Titration sleep study.

## 2022-11-22 ENCOUNTER — Other Ambulatory Visit (HOSPITAL_COMMUNITY): Payer: Self-pay

## 2022-11-22 ENCOUNTER — Other Ambulatory Visit: Payer: Self-pay

## 2022-11-22 ENCOUNTER — Inpatient Hospital Stay
Admission: RE | Admit: 2022-11-22 | Discharge: 2022-11-22 | Disposition: A | Discharge status: Home / Self Care | Payer: 59 | Source: Ambulatory Visit

## 2022-11-22 ENCOUNTER — Other Ambulatory Visit (HOSPITAL_COMMUNITY): Payer: Self-pay | Admitting: Internal Medicine

## 2022-11-22 DIAGNOSIS — I429 Cardiomyopathy, unspecified: Secondary | ICD-10-CM | POA: Insufficient documentation

## 2022-11-22 DIAGNOSIS — G4733 Obstructive sleep apnea (adult) (pediatric): Secondary | ICD-10-CM

## 2022-11-23 ENCOUNTER — Ambulatory Visit
Admission: RE | Admit: 2022-11-23 | Discharge: 2022-11-23 | Disposition: A | Discharge status: Home / Self Care | Payer: 59 | Source: Ambulatory Visit | Attending: Internal Medicine | Admitting: Internal Medicine

## 2022-11-23 DIAGNOSIS — G4761 Periodic limb movement disorder: Secondary | ICD-10-CM

## 2022-11-23 DIAGNOSIS — G4733 Obstructive sleep apnea (adult) (pediatric): Secondary | ICD-10-CM | POA: Insufficient documentation

## 2022-11-28 DIAGNOSIS — I517 Cardiomegaly: Secondary | ICD-10-CM

## 2022-11-30 ENCOUNTER — Other Ambulatory Visit (HOSPITAL_COMMUNITY): Payer: Self-pay | Admitting: Internal Medicine

## 2022-11-30 DIAGNOSIS — G4733 Obstructive sleep apnea (adult) (pediatric): Secondary | ICD-10-CM

## 2022-12-28 ENCOUNTER — Other Ambulatory Visit: Payer: Self-pay

## 2022-12-28 ENCOUNTER — Ambulatory Visit: Payer: 59 | Attending: Internal Medicine | Admitting: Internal Medicine

## 2022-12-28 ENCOUNTER — Encounter (HOSPITAL_COMMUNITY): Payer: Self-pay | Admitting: Internal Medicine

## 2022-12-28 VITALS — BP 130/82 | HR 98 | Temp 97.0°F | Resp 16 | Ht 71.0 in | Wt 233.6 lb

## 2022-12-28 DIAGNOSIS — G4733 Obstructive sleep apnea (adult) (pediatric): Secondary | ICD-10-CM | POA: Insufficient documentation

## 2022-12-28 NOTE — Progress Notes (Signed)
PULMONARY, [facility]  Operated by [facility]  [address] [place] [address]  Dept: [phone]  Dept Fax: [phone]   PULMONARY OFFICE VISIT  James Frost, 52 y.o. male  Date of Birth:  11-29-1970  Date of service: 12/28/2022  ASSESSMENT AND PLAN    ASSESSMENT:  - Severe obstructive sleep apnea      RECOMMENDATIONS:  - He has not yet received his BiPAP from the [place]. Per patient's girlfriend, they were told it is going to take additional 4-5 weeks. My staff has also been [facility].  - Advised patient to avoid supine position was sleeping if possible with pillows until he receives his PAP therapy.  - start Auto-BiPAP 14/5 with a pressure support of 4 cm H20.   - patient counseled to obtain at least 7-8 hours of sleep each night.  - Patient should follow instructions for proper sleep hygiene including avoid electronic screen exposure prior to bedtime, avoid caffeine intake after 12 noon, maintain restful bedtime environment, utilize bed only for sleep and sex.  - Weight management encouraged  - Patient should avoid alcohol and medications such as sedatives and hypnotics tranquilizers, narcotic pain medications, etc., that can worsen sleep apnea syndrome  - Patient should use caution with regard to driving and operating heavy machinery or any activity that needs the patient's alertness if the patient feels excessively sleepy or drowsy.  - follow up in 3 months with compliance report        Return to clinic in: 3 months    Lennox Pippins, MD    SUBJECTIVE     James Frost is a 52 y.o. male who presents to the pulmonary office as a follow up.   He has a known history of severe obstructive sleep apnea, hypertension    Patient is here for a follow up visit accompanied with his wife.  Sine his last office visit, he has had a diagnostic sleep study on 11/03/2022 that reported severe obstructive sleep apnea with an AHI of 48.4. he  underwent titration study on 11/5 that was inadequate. He subsequently had another titration study on 11/20 that reported that due to known CPAP intolerance BiPAP of 10/6 nasal pillow with a chin strap was successful in supporting the upper airway and eliminating obstructive events during the study. A script for Auto-BIAP was then sent/faxed to [place] on 11/21.   Patient reports he has not received his BiPAP yet. Patient's girlfriend  repotrs that she had called the [facility] 2-3 days ago and was told that it is going to take another 4-5 weeks for them to get their BiPAP.  He has no new complaints at this time.    Sleep History: Severe obstructive sleep apnea  Most recent sleep study date: 11/29/2022 titration study  Initial AHI: 48.4  Residual AHI: n/a  Type of therapy: Has not received Auto-BiPAP yet from the [place]  Compliance: n/a  Epworth Sleepiness Scale: 16    BRIEF HPI 09/04/2022:  James Frost is a 52 y.o. male who presents to the pulmonary office as a new patient for further evaluation of sleep consult.   He has a known history of hypertension     Snoring, observed apneas by his girlfriend, excessive daytime sleepiness. He reports headaches. He does not feel refreshed on waking up in the morning. These are symptoms ongoing since many years.     Sleep History:  none  Most recent sleep study date: none available  AHI: n/a  RDI: n/a  Type of therapy: not applicable  Compliance: n/a  Epworth Sleepiness Scale: 16    ROS:   Other than ROS in the HPI, all other systems were negative.    EXAM:     Vitals:    12/28/22 1141   BP: 130/82   Pulse: 98   Resp: 16   Temp: 36.1 C (97 F)   SpO2: 94%   Weight: 106 kg (233 lb 9.6 oz)   Height: 1.803 m (5\' 11" )   BMI: 32.58     General: does not appear in acute distress  HEENT: Atraumatic normocephalic.   Neurological: Awake, alert , oriented.     Total time spent on encounter was 10 minutes. In addition to face to face time with the patient  performing history and exam, this includes time spent before and after the visit reviewing records, personally reviewing prior imaging, reviewing prior cardiac studies as available, reviewing prior outpatient notes as available, reviewing the patient's medication record, coordinating with office staff and time spent performing final documentation in the medical record.    Lennox Pippins, MD

## 2023-01-04 ENCOUNTER — Ambulatory Visit
Admission: RE | Admit: 2023-01-04 | Discharge: 2023-01-04 | Disposition: A | Discharge status: Home / Self Care | Payer: 59 | Source: Ambulatory Visit

## 2023-01-04 ENCOUNTER — Other Ambulatory Visit: Payer: Self-pay

## 2023-01-04 DIAGNOSIS — F17210 Nicotine dependence, cigarettes, uncomplicated: Secondary | ICD-10-CM | POA: Insufficient documentation

## 2023-01-04 DIAGNOSIS — Z122 Encounter for screening for malignant neoplasm of respiratory organs: Secondary | ICD-10-CM | POA: Insufficient documentation

## 2023-04-02 ENCOUNTER — Ambulatory Visit (HOSPITAL_COMMUNITY): Payer: Self-pay | Admitting: Internal Medicine

## 2023-05-21 ENCOUNTER — Encounter (HOSPITAL_COMMUNITY): Payer: Self-pay | Admitting: Internal Medicine

## 2023-05-21 ENCOUNTER — Other Ambulatory Visit: Payer: Self-pay

## 2023-05-21 ENCOUNTER — Ambulatory Visit: Payer: Self-pay | Attending: Internal Medicine | Admitting: Internal Medicine

## 2023-05-21 VITALS — BP 128/88 | HR 94 | Temp 97.3°F | Resp 16 | Ht 71.0 in | Wt 211.8 lb

## 2023-05-21 DIAGNOSIS — G4733 Obstructive sleep apnea (adult) (pediatric): Secondary | ICD-10-CM

## 2023-05-21 NOTE — Progress Notes (Signed)
 PULMONARY, [facility]  Operated by [facility]  [address] [place] [address]  Dept: [phone]  Dept Fax: [phone]   PULMONARY OFFICE VISIT  James Frost, James Frost, 53 y.o. male  Date of Birth:  02-Jun-1970  Date of service: 05/21/2023  ASSESSMENT AND PLAN    ASSESSMENT:  - Severe obstructive sleep apnea non-adherent to BiPAP (initial AHI 48.4)      RECOMMENDATIONS:  - Compliance report reviewed. He is non-adherent to BiPAP. I have informed and encouraged him to consistently use BiPAP at night. He is motivated to use it moving forward. I have also discussed alternative options such as hypoglossal nerve stimulator if he remains intolerant to BiPAP.  - Continue Auto-BiPAP 14/5 with a pressure support of 3 cm H20.   - patient counseled to obtain at least 7-8 hours of sleep each night.  - Patient should follow instructions for proper sleep hygiene including avoid electronic screen exposure prior to bedtime, avoid caffeine intake after 12 noon, maintain restful bedtime environment, utilize bed only for sleep and sex.  - Weight management encouraged  - Patient should avoid alcohol and medications such as sedatives and hypnotics tranquilizers, narcotic pain medications, etc., that can worsen sleep apnea syndrome  - Patient should use caution with regard to driving and operating heavy machinery or any activity that needs the patient's alertness if the patient feels excessively sleepy or drowsy.  - follow up in 3 months with compliance report        Return to clinic in: 3 months    James Michelle, MD    SUBJECTIVE     James Frost is a 53 y.o. male who presents to the pulmonary office as a follow up.   He has a known history of severe obstructive sleep apnea, hypertension    Patient is here for a follow up visit.  He has received his BIPAP from the [place].  The reports he had sinus infection and was not able to use his BiPAP.  He denies any specific issues witrh the device but states its  hard to get used to.  Compliance report reviewed. He has not been using his BiPAP    Sleep History: Severe obstructive sleep apnea  Most recent sleep study date: 11/29/2022 titration study  Initial AHI: 48.4  [facility]  Device: resmed  Interface: nasal pillow  Residual AHI: 2.8  Type of therapy: Auto-BiPAP 14/5, PS 3 cm H2O  Compliance: 5%    12/28/2022: Since his last office visit, he has had a diagnostic sleep study on 11/03/2022 that reported severe obstructive sleep apnea with an AHI of 48.4. he underwent titration study on 11/5 that was inadequate. He subsequently had another titration study on 11/20 that reported that due to known CPAP intolerance BiPAP of 10/6 nasal pillow with a chin strap was successful in supporting the upper airway and eliminating obstructive events during the study. A script for Auto-BIAP was then sent/faxed to [place] on 11/21 but he has not yet received it. My staff reached out to [place] again.    BRIEF HPI 09/04/2022:  James Frost is a 53 y.o. male who presents to the pulmonary office as a new patient for further evaluation of sleep consult.   He has a known history of hypertension     Snoring, observed apneas by his girlfriend, excessive daytime sleepiness. He reports headaches. He does not feel refreshed on waking up in the morning. These are symptoms ongoing since many years.  Sleep History: none  Most recent sleep study date: none available  AHI: n/a  RDI: n/a  Type of therapy: not applicable  Compliance: n/a  Epworth Sleepiness Scale: 16    ROS:   Other than ROS in the HPI, all other systems were negative.    EXAM:  Temperature: 36.3 C (97.3 F)  Heart Rate: 94  BP (Non-Invasive): 128/88  Respiratory Rate: 16  SpO2: 100 %  Vitals:    05/21/23 1343   BP: 128/88   Pulse: 94   Resp: 16   Temp: 36.3 C (97.3 F)   TempSrc: Temporal   SpO2: 100%   Weight: 96.1 kg (211 lb 12.8 oz)   Height: 1.803 m (5\' 11" )   BMI: 29.54     General: does not appear in acute distress  HEENT: Atraumatic  normocephalic.   Neck: Trachea in the midline. Supple.   Cardiovascular: S1-S2 heard  Lungs: Good air entry bilaterally.   Abdomen: Soft, nontender.   Extremities: No edema clubbing or cyanosis.  Skin: Dry, warm to touch. Normal turgor.   Neurological: Awake, alert , oriented. Nonfocal.     Total time spent on encounter was 30 minutes. In addition to face to face time with the patient performing history and exam, this includes time spent before and after the visit reviewing records, personally reviewing prior imaging, reviewing prior cardiac studies as available, reviewing prior outpatient notes as available, reviewing the patient's medication record, coordinating with office staff and time spent performing final documentation in the medical record.    James Lipsett, MD

## 2023-07-03 ENCOUNTER — Other Ambulatory Visit (HOSPITAL_COMMUNITY): Payer: Self-pay | Admitting: PHYSICAL MEDICINE AND REHABILITATION

## 2023-07-03 DIAGNOSIS — I429 Cardiomyopathy, unspecified: Secondary | ICD-10-CM

## 2023-07-10 ENCOUNTER — Ambulatory Visit (HOSPITAL_BASED_OUTPATIENT_CLINIC_OR_DEPARTMENT_OTHER)
Admission: RE | Admit: 2023-07-10 | Discharge: 2023-07-10 | Disposition: A | Discharge status: Home / Self Care | Source: Ambulatory Visit | Attending: PHYSICAL MEDICINE AND REHABILITATION | Admitting: PHYSICAL MEDICINE AND REHABILITATION

## 2023-07-10 ENCOUNTER — Other Ambulatory Visit (HOSPITAL_COMMUNITY): Payer: Self-pay | Admitting: PHYSICAL MEDICINE AND REHABILITATION

## 2023-07-10 ENCOUNTER — Ambulatory Visit: Attending: PHYSICAL MEDICINE AND REHABILITATION

## 2023-07-10 ENCOUNTER — Other Ambulatory Visit: Payer: Self-pay

## 2023-07-10 DIAGNOSIS — M25511 Pain in right shoulder: Secondary | ICD-10-CM

## 2023-07-10 DIAGNOSIS — R52 Pain, unspecified: Secondary | ICD-10-CM

## 2023-07-10 DIAGNOSIS — M25512 Pain in left shoulder: Secondary | ICD-10-CM

## 2023-07-10 DIAGNOSIS — M1711 Unilateral primary osteoarthritis, right knee: Secondary | ICD-10-CM | POA: Insufficient documentation

## 2023-07-10 DIAGNOSIS — M1712 Unilateral primary osteoarthritis, left knee: Secondary | ICD-10-CM | POA: Insufficient documentation

## 2023-07-10 DIAGNOSIS — M19012 Primary osteoarthritis, left shoulder: Secondary | ICD-10-CM | POA: Insufficient documentation

## 2023-07-10 DIAGNOSIS — I429 Cardiomyopathy, unspecified: Secondary | ICD-10-CM | POA: Insufficient documentation

## 2023-07-10 DIAGNOSIS — M19011 Primary osteoarthritis, right shoulder: Secondary | ICD-10-CM | POA: Insufficient documentation

## 2023-07-10 LAB — COMPREHENSIVE METABOLIC PNL, FASTING
ALBUMIN: 3.8 g/dL (ref 3.5–5.0)
ALKALINE PHOSPHATASE: 86 U/L (ref 45–115)
ALT (SGPT): 10 U/L (ref <43)
ANION GAP: 8 mmol/L (ref 4–13)
AST (SGOT): 14 U/L (ref 11–34)
BILIRUBIN TOTAL: 0.7 mg/dL (ref 0.3–1.3)
BUN/CREA RATIO: 14 (ref 6–22)
BUN: 10 mg/dL (ref 8–25)
CALCIUM: 8.6 mg/dL (ref 8.6–10.2)
CHLORIDE: 105 mmol/L (ref 96–111)
CO2 TOTAL: 29 mmol/L (ref 22–30)
CREATININE: 0.74 mg/dL — ABNORMAL LOW (ref 0.75–1.35)
ESTIMATED GFR - MALE: >90 mL/min/BSA (ref >=60)
GLUCOSE: 132 mg/dL — ABNORMAL HIGH (ref 70–99)
POTASSIUM: 3.7 mmol/L (ref 3.5–5.1)
PROTEIN TOTAL: 6 g/dL (ref 6.0–7.9)
SODIUM: 142 mmol/L (ref 136–145)

## 2023-07-10 LAB — URINALYSIS, MACRO/MICRO
BILIRUBIN: NEGATIVE mg/dL
BLOOD: NEGATIVE mg/dL
GLUCOSE: NEGATIVE mg/dL
NITRITE: NEGATIVE
PH: 6.5 (ref 5.0–8.0)
SPECIFIC GRAVITY: 1.015 (ref 1.005–?)
UROBILINOGEN: 4 mg/dL — AB (ref 0.2–1.0)

## 2023-07-10 LAB — URINALYSIS, MICROSCOPIC: BACTERIA: NEGATIVE /HPF

## 2023-07-11 ENCOUNTER — Ambulatory Visit
Admission: RE | Admit: 2023-07-11 | Discharge: 2023-07-11 | Disposition: A | Discharge status: Home / Self Care | Payer: Self-pay | Source: Ambulatory Visit | Attending: PHYSICAL MEDICINE AND REHABILITATION | Admitting: PHYSICAL MEDICINE AND REHABILITATION

## 2023-07-11 DIAGNOSIS — I429 Cardiomyopathy, unspecified: Secondary | ICD-10-CM

## 2023-07-11 DIAGNOSIS — M170 Bilateral primary osteoarthritis of knee: Secondary | ICD-10-CM

## 2023-07-11 DIAGNOSIS — M25561 Pain in right knee: Secondary | ICD-10-CM

## 2023-07-11 DIAGNOSIS — M25562 Pain in left knee: Secondary | ICD-10-CM

## 2023-07-11 LAB — TRANSTHORACIC ECHOCARDIOGRAM - ADULT
EF VISUAL ESTIMATE: 40
EF: 45

## 2023-07-11 MED ORDER — SODIUM CHLORIDE 0.9 % INJECTION SOLUTION
2.0000 mL | Freq: Once | INTRAVENOUS | Status: DC | PRN
Start: 2023-07-11 — End: 2023-07-12
  Administered 2023-07-11: 2 mL via INTRAVENOUS

## 2023-07-23 ENCOUNTER — Ambulatory Visit (HOSPITAL_COMMUNITY): Payer: Self-pay

## 2023-08-29 ENCOUNTER — Telehealth (HOSPITAL_COMMUNITY): Payer: Self-pay

## 2023-08-29 NOTE — Telephone Encounter (Signed)
 Lm to R/S, Dr Azharuddin not in office, to be seen on 09/27/23 or 09/28/23.

## 2023-08-30 ENCOUNTER — Telehealth (HOSPITAL_COMMUNITY): Payer: Self-pay

## 2023-08-30 NOTE — Telephone Encounter (Signed)
 Left message to reschedule appointment on 09/24/23 due to Dr. Marlinda not in office.

## 2023-09-04 ENCOUNTER — Telehealth (HOSPITAL_COMMUNITY): Payer: Self-pay

## 2023-09-04 NOTE — Telephone Encounter (Signed)
 Left message to reschedule appointment on 09/24/23 due to Dr. Marlinda not in office.

## 2023-09-17 ENCOUNTER — Ambulatory Visit (HOSPITAL_COMMUNITY): Payer: Self-pay | Admitting: Internal Medicine

## 2023-09-24 ENCOUNTER — Ambulatory Visit (HOSPITAL_COMMUNITY): Payer: Self-pay | Admitting: Internal Medicine

## 2023-10-17 ENCOUNTER — Ambulatory Visit (HOSPITAL_COMMUNITY): Payer: Self-pay | Admitting: Internal Medicine

## 2023-12-11 ENCOUNTER — Other Ambulatory Visit (HOSPITAL_COMMUNITY): Payer: Self-pay

## 2023-12-11 DIAGNOSIS — Z87891 Personal history of nicotine dependence: Secondary | ICD-10-CM

## 2023-12-11 DIAGNOSIS — Z122 Encounter for screening for malignant neoplasm of respiratory organs: Secondary | ICD-10-CM

## 2023-12-27 ENCOUNTER — Other Ambulatory Visit (HOSPITAL_COMMUNITY): Payer: Self-pay

## 2023-12-27 DIAGNOSIS — I429 Cardiomyopathy, unspecified: Secondary | ICD-10-CM

## 2023-12-31 ENCOUNTER — Ambulatory Visit
Admission: RE | Admit: 2023-12-31 | Discharge: 2023-12-31 | Disposition: A | Discharge status: Home / Self Care | Source: Ambulatory Visit | Attending: PHYSICAL MEDICINE AND REHABILITATION | Admitting: PHYSICAL MEDICINE AND REHABILITATION

## 2023-12-31 ENCOUNTER — Other Ambulatory Visit: Payer: Self-pay

## 2023-12-31 ENCOUNTER — Other Ambulatory Visit (HOSPITAL_COMMUNITY): Payer: Self-pay | Admitting: PHYSICAL MEDICINE AND REHABILITATION

## 2023-12-31 DIAGNOSIS — M1711 Unilateral primary osteoarthritis, right knee: Secondary | ICD-10-CM

## 2024-01-05 DIAGNOSIS — M1711 Unilateral primary osteoarthritis, right knee: Secondary | ICD-10-CM

## 2024-01-07 ENCOUNTER — Ambulatory Visit
Admission: RE | Admit: 2024-01-07 | Discharge: 2024-01-07 | Disposition: A | Discharge status: Home / Self Care | Payer: Self-pay | Source: Ambulatory Visit

## 2024-01-07 ENCOUNTER — Other Ambulatory Visit: Payer: Self-pay

## 2024-01-07 DIAGNOSIS — I429 Cardiomyopathy, unspecified: Secondary | ICD-10-CM | POA: Insufficient documentation

## 2024-01-07 DIAGNOSIS — I517 Cardiomegaly: Secondary | ICD-10-CM

## 2024-01-07 LAB — TRANSTHORACIC ECHOCARDIOGRAM - ADULT
EF VISUAL ESTIMATE: 45
EF: 50

## 2024-01-09 ENCOUNTER — Inpatient Hospital Stay (HOSPITAL_COMMUNITY): Admission: RE | Admit: 2024-01-09 | Payer: BC Managed Care – PPO | Source: Ambulatory Visit | Admitting: Otolaryngology

## 2024-01-12 ENCOUNTER — Ambulatory Visit
Admission: RE | Admit: 2024-01-12 | Discharge: 2024-01-12 | Disposition: A | Discharge status: Home / Self Care | Source: Ambulatory Visit

## 2024-01-12 ENCOUNTER — Other Ambulatory Visit: Payer: Self-pay

## 2024-01-12 DIAGNOSIS — Z87891 Personal history of nicotine dependence: Secondary | ICD-10-CM | POA: Insufficient documentation

## 2024-01-12 DIAGNOSIS — Z122 Encounter for screening for malignant neoplasm of respiratory organs: Secondary | ICD-10-CM | POA: Insufficient documentation

## 2024-01-25 DIAGNOSIS — Z87891 Personal history of nicotine dependence: Secondary | ICD-10-CM

## 2024-01-25 DIAGNOSIS — Z122 Encounter for screening for malignant neoplasm of respiratory organs: Secondary | ICD-10-CM
